# Patient Record
Sex: Male | Born: 1950 | Race: White | Hispanic: No | Marital: Single | Smoking: Former smoker
Health system: Southern US, Community
[De-identification: ages and names within clinical notes are randomized; demographics above are authoritative.]

## PROBLEM LIST (undated history)

## (undated) DIAGNOSIS — J449 Chronic obstructive pulmonary disease, unspecified: Secondary | ICD-10-CM

## (undated) DIAGNOSIS — I2510 Atherosclerotic heart disease of native coronary artery without angina pectoris: Secondary | ICD-10-CM

## (undated) DIAGNOSIS — K219 Gastro-esophageal reflux disease without esophagitis: Secondary | ICD-10-CM

## (undated) DIAGNOSIS — H9190 Unspecified hearing loss, unspecified ear: Secondary | ICD-10-CM

## (undated) DIAGNOSIS — Z9289 Personal history of other medical treatment: Secondary | ICD-10-CM

## (undated) DIAGNOSIS — R51 Headache: Secondary | ICD-10-CM

## (undated) DIAGNOSIS — R0602 Shortness of breath: Secondary | ICD-10-CM

## (undated) DIAGNOSIS — J069 Acute upper respiratory infection, unspecified: Secondary | ICD-10-CM

## (undated) DIAGNOSIS — E785 Hyperlipidemia, unspecified: Secondary | ICD-10-CM

## (undated) DIAGNOSIS — I10 Essential (primary) hypertension: Secondary | ICD-10-CM

## (undated) HISTORY — PX: EYE SURGERY: SHX253

## (undated) HISTORY — PX: CARDIAC CATHETERIZATION: SHX172

## (undated) HISTORY — DX: Personal history of other medical treatment: Z92.89

## (undated) HISTORY — DX: Essential (primary) hypertension: I10

## (undated) HISTORY — PX: OTHER SURGICAL HISTORY: SHX169

## (undated) HISTORY — PX: BACK SURGERY: SHX140

## (undated) HISTORY — DX: Hyperlipidemia, unspecified: E78.5

---

## 2007-09-28 ENCOUNTER — Ambulatory Visit (HOSPITAL_COMMUNITY): Admission: RE | Admit: 2007-09-28 | Discharge: 2007-09-28 | Payer: Self-pay | Admitting: Orthopedic Surgery

## 2009-10-11 ENCOUNTER — Ambulatory Visit (HOSPITAL_COMMUNITY): Admission: RE | Admit: 2009-10-11 | Discharge: 2009-10-11 | Payer: Self-pay | Admitting: Orthopedic Surgery

## 2010-03-29 HISTORY — PX: ESOPHAGOGASTRODUODENOSCOPY: SHX1529

## 2011-04-02 HISTORY — PX: LUNG REMOVAL, PARTIAL: SHX233

## 2011-04-04 ENCOUNTER — Telehealth: Payer: Self-pay | Admitting: Emergency Medicine

## 2011-04-04 NOTE — Telephone Encounter (Signed)
I spoke with Caleb [name] and she states she was scheduling pt's PET and stated many showed up in the lsit and did not know which one to pick. I advised her it was per image skull base to thigh. She voiced her understanding and needed nothing further

## 2011-04-05 ENCOUNTER — Other Ambulatory Visit (HOSPITAL_COMMUNITY): Payer: Self-pay | Admitting: Nephrology

## 2011-04-05 DIAGNOSIS — R9389 Abnormal findings on diagnostic imaging of other specified body structures: Secondary | ICD-10-CM

## 2011-04-10 ENCOUNTER — Other Ambulatory Visit (HOSPITAL_COMMUNITY): Payer: Self-pay | Admitting: Nephrology

## 2011-04-10 ENCOUNTER — Encounter (HOSPITAL_COMMUNITY)
Admission: RE | Admit: 2011-04-10 | Discharge: 2011-04-10 | Disposition: A | Discharge status: Home / Self Care | Payer: 59 | Source: Ambulatory Visit | Attending: Nephrology | Admitting: Nephrology

## 2011-04-10 DIAGNOSIS — R911 Solitary pulmonary nodule: Secondary | ICD-10-CM | POA: Insufficient documentation

## 2011-04-10 DIAGNOSIS — I2510 Atherosclerotic heart disease of native coronary artery without angina pectoris: Secondary | ICD-10-CM | POA: Insufficient documentation

## 2011-04-10 DIAGNOSIS — R9389 Abnormal findings on diagnostic imaging of other specified body structures: Secondary | ICD-10-CM

## 2011-04-10 DIAGNOSIS — J438 Other emphysema: Secondary | ICD-10-CM | POA: Insufficient documentation

## 2011-04-10 DIAGNOSIS — I6529 Occlusion and stenosis of unspecified carotid artery: Secondary | ICD-10-CM | POA: Insufficient documentation

## 2011-04-10 LAB — GLUCOSE, CAPILLARY: Glucose-Capillary: 94 mg/dL (ref 70–99)

## 2011-04-10 MED ORDER — FLUDEOXYGLUCOSE F - 18 (FDG) INJECTION
17.8000 | Freq: Once | INTRAVENOUS | Status: AC | PRN
  Administered 2011-04-10: 17.8 via INTRAVENOUS

## 2011-04-17 ENCOUNTER — Encounter: Payer: Self-pay | Admitting: Emergency Medicine

## 2011-04-17 ENCOUNTER — Ambulatory Visit (INDEPENDENT_AMBULATORY_CARE_PROVIDER_SITE_OTHER): Payer: 59 | Admitting: Emergency Medicine

## 2011-04-17 DIAGNOSIS — J449 Chronic obstructive pulmonary disease, unspecified: Secondary | ICD-10-CM | POA: Insufficient documentation

## 2011-04-17 DIAGNOSIS — F17200 Nicotine dependence, unspecified, uncomplicated: Secondary | ICD-10-CM

## 2011-04-17 DIAGNOSIS — R222 Localized swelling, mass and lump, trunk: Secondary | ICD-10-CM | POA: Insufficient documentation

## 2011-04-17 DIAGNOSIS — Z720 Tobacco use: Secondary | ICD-10-CM

## 2011-04-17 NOTE — Assessment & Plan Note (Signed)
-   discussed cessation.  

## 2011-04-17 NOTE — Patient Instructions (Signed)
We will arrange for bronchoscopy at [facility] on 04/24/11 Your case will be discussed at thoracic conference to consider possible surgical biopsy instead.  Follow with Dr Delton Coombes in 1 month

## 2011-04-17 NOTE — Assessment & Plan Note (Signed)
-   will arrange for ENB, but will discuss case in [facility] conference to see if going straight to OR for resection makes more sense.  - spirometry today to see if he could tolerate resection.

## 2011-04-17 NOTE — Progress Notes (Signed)
Subjective:    Patient ID: Caleb [name], male    DOB: 04-01-1951, 61 y.o.   MRN: [id number]  HPI 61 yo man, smoker (80 pk-yrs), hx of COPD on Symbicort + prn SABA. Also hx allergies and HTN. Referred for CT scan chest with RUL mass in 04/01/2011. PET scan 04/10/11. He is feeling well, not coughing. The scan was prompted by URI and bronchitis in November and December. Has lot about 20 lbs over 3 months.    Review of Systems  Constitutional: Positive for unexpected weight change. Negative for fever.  HENT: Positive for congestion. Negative for ear pain, nosebleeds, sore throat, rhinorrhea, sneezing, trouble swallowing, dental problem, postnasal drip and sinus pressure.   Eyes: Negative.  Negative for redness and itching.  Respiratory: Positive for shortness of breath. Negative for cough, chest tightness and wheezing.   Cardiovascular: Negative.  Negative for palpitations and leg swelling.  Gastrointestinal: Negative.  Negative for nausea and vomiting.  Genitourinary: Negative.  Negative for dysuria.  Musculoskeletal: Positive for arthralgias. Negative for joint swelling.  Skin: Negative.  Negative for rash.  Neurological: Negative.  Negative for headaches.  Hematological: Negative.  Does not bruise/bleed easily.  Psychiatric/Behavioral: Negative for dysphoric mood. The patient is nervous/anxious.     Past Medical History  Diagnosis Date  . Hypertension   . Emphysema   . Hyperlipidemia   . Allergic rhinitis      Family History  Problem Relation Age of Onset  . Emphysema Mother   . Allergies Brother   . Allergies Sister     x2  . Heart disease Sister   . Heart failure Father   . Heart disease Mother   . Esophageal cancer Cousin   . Stomach cancer Cousin   . Lymphoma Cousin      History   Social History  . Marital Status: Married    Spouse Name: N/A    Number of Children: N/A  . Years of Education: N/A   Occupational History  . RETIRED     Journalist, newspaper, Engineer, drilling   Social History Main Topics  . Smoking status: Current Everyday Smoker -- 2.0 packs/day for 40 years    Types: Cigarettes  . Smokeless tobacco: Not on file  . Alcohol Use: Yes     rare ETOH use  . Drug Use: No  . Sexually Active: Not on file   Other Topics Concern  . Not on file   Social History Narrative  . No narrative on file     Allergies  Allergen Reactions  . Avelox (Moxifloxacin Hcl In Nacl)      No outpatient prescriptions prior to visit.        Objective:   Physical Exam  Gen: Pleasant, thin, in no distress,  normal affect  ENT: No lesions,  mouth clear,  oropharynx clear, no postnasal drip  Neck: No JVD, no TMG, no carotid bruits  Lungs: No use of accessory muscles, distant, clear without rales or rhonchi  Cardiovascular: RRR, heart sounds normal, no murmur or gallops, no peripheral edema  Musculoskeletal: No deformities, no cyanosis or clubbing  Neuro: alert, non focal  Skin: Warm, no lesions or rashes     Assessment & Plan:  Chest mass - will arrange for ENB, but will discuss case in [facility] conference to see if going straight to OR for resection makes more sense.  - spirometry today to see if he could tolerate resection.   Tobacco abuse - discussed cessation

## 2011-04-18 ENCOUNTER — Telehealth: Payer: Self-pay | Admitting: Emergency Medicine

## 2011-04-18 ENCOUNTER — Encounter (HOSPITAL_COMMUNITY): Payer: Self-pay | Admitting: Pharmacy Technician

## 2011-04-18 NOTE — Telephone Encounter (Signed)
Pt has found old cxr's from 2009 and 2011.  Dr Delton Coombes,  Does pt need to bring these by for you to review?  Please advise.

## 2011-04-18 NOTE — Telephone Encounter (Signed)
I spoke w him, he knows to bring them to [facility]

## 2011-04-18 NOTE — Telephone Encounter (Signed)
Pt called back to see if RB wants him to bring the CXRs here or not.  Pt stated he will be more than happy to bring them by if RB wants them.  Please advise.  Antionette Fairy

## 2011-04-19 ENCOUNTER — Encounter: Payer: 59 | Admitting: Cardiothoracic Surgery

## 2011-04-19 ENCOUNTER — Other Ambulatory Visit: Payer: Self-pay | Admitting: Cardiothoracic Surgery

## 2011-04-19 ENCOUNTER — Encounter: Payer: Self-pay | Admitting: Cardiothoracic Surgery

## 2011-04-19 ENCOUNTER — Institutional Professional Consult (permissible substitution) (INDEPENDENT_AMBULATORY_CARE_PROVIDER_SITE_OTHER): Payer: 59 | Admitting: Cardiothoracic Surgery

## 2011-04-19 VITALS — BP 100/64 | HR 72 | Resp 16 | Ht 66.0 in | Wt 132.0 lb

## 2011-04-19 DIAGNOSIS — R55 Syncope and collapse: Secondary | ICD-10-CM

## 2011-04-19 DIAGNOSIS — D381 Neoplasm of uncertain behavior of trachea, bronchus and lung: Secondary | ICD-10-CM

## 2011-04-19 DIAGNOSIS — R51 Headache: Secondary | ICD-10-CM

## 2011-04-19 DIAGNOSIS — D491 Neoplasm of unspecified behavior of respiratory system: Secondary | ICD-10-CM

## 2011-04-19 NOTE — Progress Notes (Signed)
[address]            [place] [address]          [phone]      Bradley Bostelman [facility] Medical Record #[id number] Date of Birth: 01-28-1951  Referring: Leslye Peer., MD Primary Care: Marylen Ponto, MD, MD  Chief Complaint:    Chief Complaint  Patient presents with  . Lung Mass    eval and treat    History of Present Illness:    Patient is a 61 year old smoker who developed upper respiratory infection symptoms in November of 2012. The symptoms were predominantly right upper chest wall discomfort and cough. Patient did lose almost 20 pounds in weight. The symptoms improved and then returned again in December. At that time he saw his primary care doctor and a chest x-ray was obtained there was a new right upper lobe lung nodule appreciated there was not present on previous chest x-rays a CT scan of the chest and PET scan was performed. The patient's case was discussed in multidisciplinary thoracic oncology clinic, and films were reviewed. The patient is referred to the surgical clinic to discuss treatment options.  He has known underlying emphysema with long-term smoking up to 40 years recently he has decreased to 5-6 cigarettes a day. He's had no past history of myocardial infarction 3-4 years ago he underwent cardiac catheterization in [facility] and was told that he had some minor coronary disease but nothing requiring invasive treatment.     Current Activity/ Functional Status: Patient is independent with mobility/ambulation, transfers, ADL's, IADL's.   Past Medical History  Diagnosis Date  . Hypertension   . Emphysema   . Hyperlipidemia   . Allergic rhinitis     Past Surgical History  Procedure Date  . Eye muscle surgery x3     2 left eye, 1 right eye  . Knee surgery, left   . Back surgery     Family History  Problem Relation Age of Onset  . Emphysema Mother   . Allergies Brother   . Allergies Sister     x2  .  Heart disease Sister Died MI age 86 46  . Heart failure Father Died 80 MI  . Heart disease Mother Died 76 MI  . Esophageal cancer Cousin   . Stomach cancer Cousin   . Lymphoma Cousin     History   Social History  . Marital Status: Married    Spouse Name: N/A    Number of Children: N/A  . Years of Education: N/A   Occupational History  . RETIRED     Journalist, newspaper, Paramedic   Social History Main Topics  . Smoking status: Current Everyday Smoker -- 2.0 packs/day for 40 years    Types: Cigarettes  . Smokeless tobacco: Not on file  . Alcohol Use: Yes     rare ETOH use  . Drug Use: No  . Sexually Active: Not on file      History  Smoking status  . Current Everyday Smoker -- 2.0 packs/day for 40 years  . Types: Cigarettes  Smokeless tobacco  . Not on file    History  Alcohol Use  . Yes    rare ETOH use     Allergies  Allergen Reactions  . Avelox (Moxifloxacin Hcl In Nacl) Rash  . Codeine Rash    Current Outpatient Prescriptions  Medication Sig Dispense Refill  . aspirin 81 MG tablet Take 81 mg by mouth 2 (two) times daily.       . Aspirin-Acetaminophen-Caffeine (GOODY HEADACHE PO) Take 1-2 packets by mouth 2 (two) times daily as needed. As needed for back pain & headaches.       [name] BENICAR HCT 40-12.5 MG per tablet Once daily      . budesonide-formoterol (SYMBICORT) 160-4.5 MCG/ACT inhaler Inhale 2 puffs into the lungs 2 (two) times daily.      . CRESTOR 10 MG tablet Take 10 mg by mouth daily. Once daily      . Multiple Vitamin (MULITIVITAMIN WITH MINERALS) TABS Take 1 tablet by mouth daily.      [name] omeprazole (PRILOSEC) 20 MG capsule Take 20 mg by mouth daily. Once daily      . VENTOLIN HFA 108 (90 BASE) MCG/ACT inhaler Inhale 2 puffs into the lungs every 6 (six) hours as needed. For shortness of breath           Review of Systems:     Cardiac Review of Systems: Y or N  Chest Pain [ y rt shoulder   ]  Resting SOB [  n ] Exertional SOB  Cove.Etienne  ]    Orthopnea [ y ]   Pedal Edema [ n  ]    Palpitations [n  ] Syncope  [ n ]   Presyncope [ n  ]  General Review of Systems: [Y] = yes [  ]=no Constitional: recent weight change [ y ]; anorexia [ n ]; fatigue Cove.Etienne  ]; nausea [  n ]; night sweats [n  ]; fever [ n ]; or chills [ n ];                                                                                                                                          Dental: poor dentition[  ]; Last Dentist visit:   Eye : blurred vision [  ]; diplopia [   ]; vision changes [  ];  Amaurosis fugax[  ]; Resp: cough [ y ];  wheezing[ y ];  hemoptysis[ n ]; shortness of breath[ y ]; paroxysmal nocturnal dyspnea[  n]; dyspnea on exertion[y  ]; or orthopnea[n  ];  GI:  gallstones[  ], vomiting[  ];  dysphagia[  ]; melena[  ];  hematochezia [  n]; heartburn[  ];   Hx of  Colonoscopy[  ]; GU: kidney stones [  ]; hematuria[  ];   dysuria [  ];  nocturia[  ];  history of     obstruction [  ];             Skin: rash, swelling[  ];, hair loss[  ];  peripheral edema[  ];  or itching[  ]; Musculosketetal: myalgias[  ];  joint swelling[  ];  joint erythema[  ];  joint pain[ y ];  back pain[y  ];  Heme/Lymph: bruising[  ];  bleeding[  ];  anemia[  ];  Neuro: TIA[  ];  headaches[  ];  stroke[  ];  vertigo[  ];  seizures[  ];   paresthesias[  ];  difficulty walking[  ];  Psych:depression[  ]; anxiety[  ];  Endocrine: diabetes[  ];  thyroid dysfunction[  ];  Immunizations: Flu [ n ]; Pneumococcal[  n];  Other:  Physical Exam: BP 100/64  Pulse 72  Resp 16  Ht 5\' 6"  (1.676 m)  Wt 132 lb (59.875 kg)  BMI 21.31 kg/m2  SpO2 96%  General appearance: alert, cooperative, appears older than stated age and no distress Neurologic: intact Heart: regular rate and rhythm, S1, S2 normal, no murmur, click, rub or gallop Lungs: diminished breath sounds bibasilar Abdomen: soft, non-tender; bowel sounds normal; no masses,  no organomegaly Extremities: extremities normal,  atraumatic, no cyanosis or edema and Homans sign is negative, no sign of DVT No cervical or supraclavicular adenopathy is appreciated   Diagnostic Studies & Laboratory data:     Recent Radiology Findings:    Nm Pet Image Initial (pi) Skull Base To Thigh  04/10/2011  *RADIOLOGY REPORT*  Clinical Data: Initial treatment strategy for abnormal chest CT, demonstrating a right upper lobe 2.5 cm nodule.  NUCLEAR MEDICINE PET CT SKULL BASE TO THIGH  Technique:  17.8 mCi F-18 FDG was injected intravenously via the right AC.  Full-ring PET imaging was performed from the skull base through the mid-thighs 60  minutes after injection.  CT data was obtained and used for attenuation correction and anatomic localization only.  (This was not acquired as a diagnostic CT examination.)  Fasting Blood Glucose:  94  Patient Weight:  128 pounds.  Comparison: Chest CT of 04/01/2011  Findings: PET images demonstrate extensive muscular activity throughout the neck and upper chest.  The right apical lung nodule primarily demonstrates low level, non malignant range hypermetabolism.  For example, this measures a S.U.V. max of 1.8 on image 61.  Along its cephalad aspect, where it contacts the pleural surface, an area of more moderate hypermetabolism measures a S.U.V. max of 2.8, including on image 58.  No abnormal activity within the nodal stations of the mediastinum or hilum.  No abnormal activity within the abdomen or pelvis.  CT images performed for attenuation correction demonstrate right maxillary sinus mucous retention cyst or polyp.  Advanced carotid atherosclerosis.  Chest findings deferred to recent diagnostic chest CT.  Coronary artery atherosclerosis, including likely within the left main coronary artery on image 99.  No acute superimposed process identified.  There is moderate to marked centrilobular emphysema. Lumbosacral spine fixation.  IMPRESSION:  1.  The right apical lung nodule demonstrates primarily low level  nonmalignant range hypermetabolism.  A portion of its peripheral/pleural-based component demonstrates borderline malignant range hypermetabolism.  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  Tissue sampling should be considered. 2.  No evidence of hypermetabolic thoracic nodal or extrathoracic disease.  Given the relative low level activity of the suspicious right apical nodule, PET may be of decreased sensitivity for nodal metastasis. 3.  Coronary artery disease, including likely within the left main.  Original Report Authenticated By: Consuello Bossier, M.D.     Recent Lab Findings: No results found for this basename: WBC, HGB, HCT, PLT, GLUCOSE, CHOL, TRIG, HDL, LDLDIRECT, LDLCALC, ALT,  AST, NA, K, CL, CREATININE, BUN, CO2, TSH, INR, GLUF, HGBA1C      Assessment / Plan:     Patient is a 61 year old smoker which radiographically presents with a new right upper lobe lung mass, that was not present on chest x-rays from 2009 in 2011 in addition a CT scan of the neck did not show any right upper lobe lung mass. The mass is not significantly metabolically active on PET scan. Because of the new onset of the mass in a long-term smoker I recommended to the patient that we proceed with surgical resection. Needle biopsy or EnG was discussed with the patient however was felt that since the lesion was new even a negative biopsy would not prevent proceeding with resection.  The patient does have a very strong family history of coronary occlusive disease and is noted to have calcium in his left main on CT scan. Prior to any surgical intervention we will obtain cardiology clearance. The patient will have a full pulmonary function evaluation MRI of the brain to rule out possible metastatic disease although he is asymptomatic.  The risks and options of VATS and lung resection are discussed with the patient and  his wife in detail. He will return next week after preoperative studies are completed to set a definite surgical date        Delight Ovens MD  Beeper [phone] Office [phone] 04/19/2011 4:56 PM

## 2011-04-19 NOTE — Patient Instructions (Signed)
Smoking Cessation This document explains the best ways for you to quit smoking and new treatments to help. It lists new medicines that can double or triple your chances of quitting and quitting for good. It also considers ways to avoid relapses and concerns you may have about quitting, including weight gain. NICOTINE: A POWERFUL ADDICTION If you have tried to quit smoking, you know how hard it can be. It is hard because nicotine is a very addictive drug. For some people, it can be as addictive as heroin or cocaine. Usually, people make 2 or 3 tries, or more, before finally being able to quit. Each time you try to quit, you can learn about what helps and what hurts. Quitting takes hard work and a lot of effort, but you can quit smoking. QUITTING SMOKING IS ONE OF THE MOST IMPORTANT THINGS YOU WILL EVER DO.  You will live longer, feel better, and live better.   The impact on your body of quitting smoking is felt almost immediately:   Within 20 minutes, blood pressure decreases. Pulse returns to its normal level.   After 8 hours, carbon monoxide levels in the blood return to normal. Oxygen level increases.   After 24 hours, chance of heart attack starts to decrease. Breath, hair, and body stop smelling like smoke.   After 48 hours, damaged nerve endings begin to recover. Sense of taste and smell improve.   After 72 hours, the body is virtually free of nicotine. Bronchial tubes relax and breathing becomes easier.   After 2 to 12 weeks, lungs can hold more air. Exercise becomes easier and circulation improves.   Quitting will reduce your risk of having a heart attack, stroke, cancer, or lung disease:   After 1 year, the risk of coronary heart disease is cut in half.   After 5 years, the risk of stroke falls to the same as a nonsmoker.   After 10 years, the risk of lung cancer is cut in half and the risk of other cancers decreases significantly.   After 15 years, the risk of coronary heart  disease drops, usually to the level of a nonsmoker.   If you are pregnant, quitting smoking will improve your chances of having a healthy baby.   The people you live with, especially your children, will be healthier.   You will have extra money to spend on things other than cigarettes.  FIVE KEYS TO QUITTING Studies have shown that these 5 steps will help you quit smoking and quit for good. You have the best chances of quitting if you use them together: 1. Get ready.  2. Get support and encouragement.  3. Learn new skills and behaviors.  4. Get medicine to reduce your nicotine addiction and use it correctly.  5. Be prepared for relapse or difficult situations. Be determined to continue trying to quit, even if you do not succeed at first.  1. GET READY  Set a quit date.   Change your environment.   Get rid of ALL cigarettes, ashtrays, matches, and lighters in your home, car, and place of work.   Do not let people smoke in your home.   Review your past attempts to quit. Think about what worked and what did not.   Once you quit, do not smoke. NOT EVEN A PUFF!  2. GET SUPPORT AND ENCOURAGEMENT Studies have shown that you have a better chance of being successful if you have help. You can get support in many ways.  Tell   your family, friends, and coworkers that you are going to quit and need their support. Ask them not to smoke around you.   Talk to your caregivers (doctor, dentist, nurse, pharmacist, psychologist, and/or smoking counselor).   Get individual, group, or telephone counseling and support. The more counseling you have, the better your chances are of quitting. Programs are available at local hospitals and health centers. Call your local health department for information about programs in your area.   Spiritual beliefs and practices may help some smokers quit.   Quit meters are small computer programs online or downloadable that keep track of quit statistics, such as amount  of "quit-time," cigarettes not smoked, and money saved.   Many smokers find one or more of the many self-help books available useful in helping them quit and stay off tobacco.  3. LEARN NEW SKILLS AND BEHAVIORS  Try to distract yourself from urges to smoke. Talk to someone, go for a walk, or occupy your time with a task.   When you first try to quit, change your routine. Take a different route to work. Drink tea instead of coffee. Eat breakfast in a different place.   Do something to reduce your stress. Take a hot bath, exercise, or read a book.   Plan something enjoyable to do every day. Reward yourself for not smoking.   Explore interactive web-based programs that specialize in helping you quit.  4. GET MEDICINE AND USE IT CORRECTLY Medicines can help you stop smoking and decrease the urge to smoke. Combining medicine with the above behavioral methods and support can quadruple your chances of successfully quitting smoking. The [place] Food and Drug Administration (FDA) has approved 7 medicines to help you quit smoking. These medicines fall into 3 categories.  Nicotine replacement therapy (delivers nicotine to your body without the negative effects and risks of smoking):   Nicotine gum: Available over-the-counter.   Nicotine lozenges: Available over-the-counter.   Nicotine inhaler: Available by prescription.   Nicotine nasal spray: Available by prescription.   Nicotine skin patches (transdermal): Available by prescription and over-the-counter.   Antidepressant medicine (helps people abstain from smoking, but how this works is unknown):   Bupropion sustained-release (SR) tablets: Available by prescription.   Nicotinic receptor partial agonist (simulates the effect of nicotine in your brain):   Varenicline tartrate tablets: Available by prescription.   Ask your caregiver for advice about which medicines to use and how to use them. Carefully read the information on the package.    Everyone who is trying to quit may benefit from using a medicine. If you are pregnant or trying to become pregnant, nursing an infant, you are under age 18, or you smoke fewer than 10 cigarettes per day, talk to your caregiver before taking any nicotine replacement medicines.   You should stop using a nicotine replacement product and call your caregiver if you experience nausea, dizziness, weakness, vomiting, fast or irregular heartbeat, mouth problems with the lozenge or gum, or redness or swelling of the skin around the patch that does not go away.   Do not use any other product containing nicotine while using a nicotine replacement product.   Talk to your caregiver before using these products if you have diabetes, heart disease, asthma, stomach ulcers, you had a recent heart attack, you have high blood pressure that is not controlled with medicine, a history of irregular heartbeat, or you have been prescribed medicine to help you quit smoking.  5. BE PREPARED FOR RELAPSE OR   DIFFICULT SITUATIONS  Most relapses occur within the first 3 months after quitting. Do not be discouraged if you start smoking again. Remember, most people try several times before they finally quit.   You may have symptoms of withdrawal because your body is used to nicotine. You may crave cigarettes, be irritable, feel very hungry, cough often, get headaches, or have difficulty concentrating.   The withdrawal symptoms are only temporary. They are strongest when you first quit, but they will go away within 10 to 14 days.  Here are some difficult situations to watch for:  Alcohol. Avoid drinking alcohol. Drinking lowers your chances of successfully quitting.   Caffeine. Try to reduce the amount of caffeine you consume. It also lowers your chances of successfully quitting.   Other smokers. Being around smoking can make you want to smoke. Avoid smokers.   Weight gain. Many smokers will gain weight when they quit, usually  less than 10 pounds. Eat a healthy diet and stay active. Do not let weight gain distract you from your main goal, quitting smoking. Some medicines that help you quit smoking may also help delay weight gain. You can always lose the weight gained after you quit.   Bad mood or depression. There are a lot of ways to improve your mood other than smoking.  If you are having problems with any of these situations, talk to your caregiver. SPECIAL SITUATIONS AND CONDITIONS Studies suggest that everyone can quit smoking. Your situation or condition can give you a special reason to quit.  Pregnant women/new mothers: By quitting, you protect your baby's health and your own.   Hospitalized patients: By quitting, you reduce health problems and help healing.   Heart attack patients: By quitting, you reduce your risk of a second heart attack.   Lung, head, and neck cancer patients: By quitting, you reduce your chance of a second cancer.   Parents of children and adolescents: By quitting, you protect your children from illnesses caused by secondhand smoke.  QUESTIONS TO THINK ABOUT Think about the following questions before you try to stop smoking. You may want to talk about your answers with your caregiver.  Why do you want to quit?   If you tried to quit in the past, what helped and what did not?   What will be the most difficult situations for you after you quit? How will you plan to handle them?   Who can help you through the tough times? Your family? Friends? Caregiver?   What pleasures do you get from smoking? What ways can you still get pleasure if you quit?  Here are some questions to ask your caregiver:  How can you help me to be successful at quitting?   What medicine do you think would be best for me and how should I take it?   What should I do if I need more help?   What is smoking withdrawal like? How can I get information on withdrawal?  Quitting takes hard work and a lot of effort,  but you can quit smoking. FOR MORE INFORMATION  Smokefree.gov (http://www.smokefree.gov) provides free, accurate, evidence-based information and professional assistance to help support the immediate and long-term needs of people trying to quit smoking. Document Released: 03/12/2001 Document Revised: 11/28/2010 Document Reviewed: 01/02/2009 [employer] Patient Information 2012 [employer].Pulmonary Nodule A pulmonary (lung) nodule is small, round growth in the lung. The size of a pulmonary nodule can be as small as a pencil eraser (1/5 inch or 4 millmeters)   to a little bigger than your biggest toenail (1 inch or 25 millimeters). A pulmonary nodule is usually an unplanned finding. It may be found on a chest X-ray or a computed tomography (CT) scan when you have imaging tests of your lungs done. When a pulmonary nodule is found, tests will be done to determine if the nodule is benign (not cancerous) or malignant (cancerous). Follow-up treatment or testing is based on the size of the pulmonary nodule and your risk of getting lung cancer.  CAUSES Causes of pulmonary nodules can vary.  Benign pulmonary nodules  can be caused from different things. Some of these things include:  Infection. This can be a common cause of a benign pulmonary nodule. The infection may be active (a current infection) or an old infection that is no longer active. Three types of infections can cause a pulmonary nodule. These are:   Bacterial Infection.   Fungal infection.   Viral Infections.   Hematoma. This is a bruise in the lung. A hematoma can happen from an injury to your chest.   Some common diseases can lead to benign pulmonary nodules. For example, rheumatoid arthritis can be a cause of a pulmonary nodule.   Other unusual things can cause a benign pulmonary nodule. These can include:   Having had tuberculosis.   Rare diseases, such as a lung cyst.  Malignant pulmonary nodules.  These are cancerous growths. The  cancer may have:  Started in the lung. Some lung cancers first detected as a pulmonary nodule.   Spread to the lung from cancer somewhere else in the body. This is called metastatic cancer.   Certain risk factors make a cancerous pulmonary nodule more likely. They include:   Age. As people get older, a pulmonary nodule is more likely to be cancerous.   Cancer history. If one of your immediate family members has had cancer, you have a higher risk of developing cancer.   Smoking. This includes people who currently smoke and those who have quit.  DIAGNOSIS To diagnose whether a pulmonary nodule is benign or malignant, a variety of tests will be done. This includes things such as:  Health history. Questions regarding your current health, past health, and family health will be asked.   Blood tests. Results of blood work can show:   Tumor markers for cancer.   Any type of infection.   A skin test called a tuberculin (TB) test may be done. This test can tell if you have been exposed to the germ that causes tuberculosis.   Imaging tests. These take pictures of your lungs. Types of imaging tests include:   Chest X-ray. This can help in several ways. An X-ray gives a close-up look at the pulmonary nodule. A new X-ray can be compared with any X-rays you have had in the past.    Computed tomography  (CT) scan. This test shows smaller pulmonary nodules more clearly than an X-ray.   Positron emission tomography  (PET) scan. This is a test that uses a radioactive substance to identify a pulmonary nodule. A safe amount of radioactive substance is injected into the blood stream. Then, the scan takes a picture of the pulmonary nodule. A malignant pulmonary nodule will absorb the substance faster than a benign pulmonary nodule. The radioactive substance is eliminated from your body in your urine.   Biopsy.  This removes a tiny piece of the pulmonary nodule so it can be checked under a microscope.  Medicine will be given   to help keep you relaxed and pain free when a biopsy is done. Types of biopsies include:   Bronchoscopy . This is a surgical procedure. It can be used for pulmonary nodules that are close to the airways in the lung. It uses a scope (a thin tube) with a tiny camera and light on the end. The scope is put in the windpipe. Your caregiver can then see inside the lung. A tiny tool put through the scope is used to take a small sample of the pulmonary nodule tissue.   Transthoracic needle aspiration . This method is used if the pulmonary nodule is far away from the air passages in the lung. A long, thin needle is put through the chest into the lung nodule. A CT scan is done at the same time which can make it easier to locate the pulmonary nodule.   Surgical lung biopsy . This is a surgical procedure in which the pulmonary nodule is removed. This is usually recommended when the pulmonary nodule is most likely malignant or a biopsy cannot be obtained by either bronchoscopy or transthoracic needle aspiration.  PULMONARY NODULE FOLLOW-UP RECOMMENDATIONS The frequency of pulmonary nodule follow-up is based on your risk factors and size of the pulmonary nodule. If your caregiver suspects the pulmonary nodule is cancerous or the pulmonary nodule changes during any of the follow-up CT scans, additional testing or biopsies will be done.   If you have no or low risk of getting lung cancer (non-smoker, no personal cancer history), recommended follow-up is based on the following pulmonary nodule size:   A pulmonary nodule that is < 4 mm does not require any follow-up.   A pulmonary nodule that is 4 to 6 mm should be re-imaged by CT scan in 12 months.   A pulmonary nodule that is 6 to 8 mm should be re-imaged by CT scan at 6 to 12 months and then again at 18 to 24 months if no change in size.   A pulmonary nodule > 8 mm in size should be followed closely and re-imaged by CT scan at 3, 9, and 24  months.    If you are at risk of getting lung cancer (current or former smoker, family history of cancer), recommended follow-up is based on the following pulmonary nodule size:   A pulmonary nodule that is < 4 mm in size should be re-imaged by CT scan in 12 months.   A pulmonary nodule that is 4 to 6 mm in size should be re-imaged by CT scan at 6 to 12 months and again at 18 to 24 months.   A pulmonary nodule that is 6 to 8 mm in size should be re-imaged by CT scan at 3, 9, and 24 months.   A pulmonary nodule > 8 mm in size should be followed closely and re-imaged by CT scan at 3, 9, and 24 months.  SEEK MEDICAL CARE IF: While waiting for test results to determine what type of pulmonary nodule you have, be sure to contact your caregiver if you:  Have trouble breathing when you are active.   Feel sick or unusually tired.   Do not feel like eating.   Lose weight without trying to.   Develop chills or night sweats.   Mild or moderate fevers generally have no long-term effects and often do not require treatment. There are a few exceptions (see below).  SEEK IMMEDIATE MEDICAL CARE IF:  You cannot catch your breath or you begin   wheezing.   You cannot stop coughing.   You cough up blood.   You feel like you are going to pass out or become dizzy.   You have sudden chest pain.   You have a fever or persistent symptoms for more than 72 hours.   You have a fever and your symptoms suddenly get worse.  MAKE SURE YOU   Understand these instructions.   Will watch your condition.   Will get help right away if you are not doing well or get worse.  Document Released: 01/13/2009 Document Revised: 11/28/2010 Document Reviewed: 01/13/2009 [employer] Patient Information 2012 [employer]. 

## 2011-04-22 ENCOUNTER — Telehealth: Payer: Self-pay | Admitting: Emergency Medicine

## 2011-04-22 ENCOUNTER — Other Ambulatory Visit (HOSPITAL_COMMUNITY): Payer: Self-pay | Admitting: Respiratory Therapy

## 2011-04-22 NOTE — Telephone Encounter (Signed)
I spoke with the pt and he states when he saw Dr. Tyrone Sage he mentioned that the area on his lung could possible be inflammation. The pt states he does not want to go through the biopsy and "lose a piece of his lung' if it is only inflammation. He also wants to know if there was any medication he could be on in the meantime to help with the inflammation. I advised in order to find out what the area is in his lungs he would need the biopsy to be sure what it is in order to properly treat it. I also advised I was not aware of any medication that he could take at this time. The pt asked that I send the message to Dr. Delton Coombes to get his advice and possibly call the patient and explain to him what it will mean if this is inflammation. Please advise. Carron Curie, CMA

## 2011-04-23 ENCOUNTER — Inpatient Hospital Stay (HOSPITAL_COMMUNITY)
Admission: RE | Admit: 2011-04-23 | Discharge: 2011-04-23 | Disposition: A | Discharge status: Home / Self Care | Payer: 59 | Source: Ambulatory Visit

## 2011-04-23 ENCOUNTER — Other Ambulatory Visit (HOSPITAL_COMMUNITY): Payer: Self-pay | Admitting: Radiology

## 2011-04-23 ENCOUNTER — Ambulatory Visit (HOSPITAL_COMMUNITY)
Admission: RE | Admit: 2011-04-23 | Discharge: 2011-04-23 | Disposition: A | Discharge status: Home / Self Care | Payer: 59 | Source: Ambulatory Visit | Attending: Cardiothoracic Surgery | Admitting: Cardiothoracic Surgery

## 2011-04-23 ENCOUNTER — Ambulatory Visit
Admission: RE | Admit: 2011-04-23 | Discharge: 2011-04-23 | Disposition: A | Discharge status: Home / Self Care | Payer: 59 | Source: Ambulatory Visit | Attending: Cardiothoracic Surgery | Admitting: Cardiothoracic Surgery

## 2011-04-23 DIAGNOSIS — J984 Other disorders of lung: Secondary | ICD-10-CM | POA: Insufficient documentation

## 2011-04-23 DIAGNOSIS — R51 Headache: Secondary | ICD-10-CM

## 2011-04-23 DIAGNOSIS — D381 Neoplasm of uncertain behavior of trachea, bronchus and lung: Secondary | ICD-10-CM

## 2011-04-23 DIAGNOSIS — R55 Syncope and collapse: Secondary | ICD-10-CM

## 2011-04-23 LAB — BLOOD GAS, ARTERIAL
Acid-Base Excess: 0.7 mmol/L (ref 0.0–2.0)
Bicarbonate: 24.2 mEq/L — ABNORMAL HIGH (ref 20.0–24.0)
Drawn by: 242311
FIO2: 0.21 %
O2 Saturation: 97.4 %
Patient temperature: 98.6
TCO2: 25.3 mmol/L (ref 0–100)
pCO2 arterial: 34.8 mmHg — ABNORMAL LOW (ref 35.0–45.0)
pH, Arterial: 7.456 — ABNORMAL HIGH (ref 7.350–7.450)
pO2, Arterial: 89.9 mmHg (ref 80.0–100.0)

## 2011-04-23 LAB — PULMONARY FUNCTION TEST

## 2011-04-23 MED ORDER — GADOBENATE DIMEGLUMINE 529 MG/ML IV SOLN
12.0000 mL | Freq: Once | INTRAVENOUS | Status: AC | PRN
  Administered 2011-04-23: 12 mL via INTRAVENOUS

## 2011-04-23 MED ORDER — ALBUTEROL SULFATE (5 MG/ML) 0.5% IN NEBU
2.5000 mg | INHALATION_SOLUTION | Freq: Once | RESPIRATORY_TRACT | Status: AC
  Administered 2011-04-23: 2.5 mg via RESPIRATORY_TRACT

## 2011-04-23 NOTE — Telephone Encounter (Signed)
Spoke with patient and answered his questions. Have reviewed case with Dr Tyrone Sage, he agrees that patient needs resection.

## 2011-04-24 ENCOUNTER — Encounter (HOSPITAL_COMMUNITY): Admission: RE | Payer: Self-pay | Source: Ambulatory Visit

## 2011-04-24 ENCOUNTER — Ambulatory Visit (HOSPITAL_COMMUNITY): Admission: RE | Admit: 2011-04-24 | Payer: 59 | Source: Ambulatory Visit | Admitting: Emergency Medicine

## 2011-04-24 SURGERY — BRONCHOSCOPY, WITH EBUS
Anesthesia: General | Laterality: Right

## 2011-04-25 ENCOUNTER — Encounter: Payer: Self-pay | Admitting: Cardiovascular Disease

## 2011-04-25 ENCOUNTER — Ambulatory Visit (INDEPENDENT_AMBULATORY_CARE_PROVIDER_SITE_OTHER): Payer: 59 | Admitting: Cardiovascular Disease

## 2011-04-25 DIAGNOSIS — I2510 Atherosclerotic heart disease of native coronary artery without angina pectoris: Secondary | ICD-10-CM

## 2011-04-25 DIAGNOSIS — R0602 Shortness of breath: Secondary | ICD-10-CM

## 2011-04-25 NOTE — Progress Notes (Signed)
HPI:  This is a 61 year old gentleman presenting for preoperative cardiac evaluation.  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 Tyrone Sage. He developed right upper chest wall pain with associated cough and weight loss. A chest x-ray showed interval development of a right upper lung nodule and this was followed up with a CT scan and PET scan.  The CT scan demonstrated incidental coronary calcification involving the left main coronary artery. Because of this finding he was referred for preoperative evaluation.  The patient has undergone previous cardiac catheterization in [facility]. This was greater than 5 years ago and by the patient's recollection there was nonobstructive disease present. He has never required coronary intervention or revascularization.  The patient denies chest pain at rest or with exertion. He walks twice per day and has no symptoms with walking. He just quit smoking last week and has smoked for greater than 40 years. He does complain of progressive dyspnea with exertion. He has occasional palpitations. He feels lightheaded when he first stands up. He denies syncope, orthopnea, PND, or leg swelling.  Outpatient Encounter Prescriptions as of 04/25/2011  Medication Sig Dispense Refill  . aspirin 81 MG tablet Take 81 mg by mouth 2 (two) times daily.       . Aspirin-Acetaminophen-Caffeine (GOODY HEADACHE PO) Take 1-2 packets by mouth 2 (two) times daily as needed. As needed for back pain & headaches.       [name] BENICAR HCT 40-12.5 MG per tablet Once daily      . budesonide-formoterol (SYMBICORT) 160-4.5 MCG/ACT inhaler Inhale 2 puffs into the lungs 2 (two) times daily.      . CRESTOR 10 MG tablet Take 10 mg by mouth daily. Once daily      . Multiple Vitamin (MULITIVITAMIN WITH MINERALS) TABS Take 1 tablet by mouth daily.      [name] omeprazole (PRILOSEC) 20 MG capsule Take 20 mg by mouth daily. Once daily      . VENTOLIN HFA 108 (90 BASE) MCG/ACT  inhaler Inhale 2 puffs into the lungs every 6 (six) hours as needed. For shortness of breath        Avelox and Codeine  Past Medical History  Diagnosis Date  . Hypertension   . Emphysema   . Hyperlipidemia   . Allergic rhinitis     Past Surgical History  Procedure Date  . Eye muscle surgery x3     2 left eye, 1 right eye  . Knee surgery, left   . Back surgery     History   Social History  . Marital Status: Married    Spouse Name: N/A    Number of Children: N/A  . Years of Education: N/A   Occupational History  . RETIRED     Journalist, newspaper, Paramedic   Social History Main Topics  . Smoking status: Current Everyday Smoker -- 2.0 packs/day for 40 years    Types: Cigarettes  . Smokeless tobacco: Not on file  . Alcohol Use: Yes     rare ETOH use  . Drug Use: No  . Sexually Active: Not on file   Other Topics Concern  . Not on file   Social History Narrative  . No narrative on file    Family History  Problem Relation Age of Onset  . Emphysema Mother   . Allergies Brother   . Allergies Sister     x2  . Heart disease Sister   . Heart  failure Father   . Heart disease Mother   . Esophageal cancer Cousin   . Stomach cancer Cousin   . Lymphoma Cousin     ROS: General: no fevers/chills/night sweats Eyes: no blurry vision, diplopia, or amaurosis ENT: no sore throat or hearing loss Resp: no cough, wheezing, or hemoptysis. Positive for shortness of breath CV: no edema or palpitations GI: no abdominal pain, nausea, vomiting, diarrhea, or constipation GU: no dysuria, frequency, or hematuria Skin: no rash Neuro: no headache, numbness, tingling, or weakness of extremities Musculoskeletal: no joint pain or swelling Heme: no bleeding, DVT, or easy bruising Endo: no polydipsia or polyuria  BP 110/74  Pulse 62  Ht 5\' 6"  (1.676 m)  Wt 57.788 kg (127 lb 6.4 oz)  BMI 20.56 kg/m2  PHYSICAL EXAM: Pt is alert and oriented, WD, WN, in no distress. HEENT:  normal Neck: JVP normal. Carotid upstrokes normal without bruits. No thyromegaly. Lungs: equal expansion, diminished breath sounds bilaterally CV: Apex is nonpalpable, RRR without murmur or gallop, distant heart sounds Abd: soft, NT, +BS, no bruit, no hepatosplenomegaly Back: no CVA tenderness Ext: no C/C/E        Femoral pulses 2+=         DP/PT pulses intact and = Skin: warm and dry without rash Neuro: CNII-XII intact             Strength intact = bilaterally  EKG:  Normal sinus rhythm 62 beats per minute, cannot rule out age indeterminate septal infarct, otherwise within normal limits.  ASSESSMENT AND PLAN:

## 2011-04-25 NOTE — Patient Instructions (Signed)
Your physician has requested that you have an exercise stress myoview. For further information please visit [email or web address] Please follow instruction sheet, as given.  Your physician recommends that you schedule a follow-up appointment as needed.

## 2011-04-25 NOTE — Assessment & Plan Note (Signed)
The patient has coronary artery disease based on chest CT findings. He has upcoming surgery planned involving resection of a lung mass. The patient is limited by exertional dyspnea, and I think it is prudent to evaluate him with a preoperative stress test. He feels like he can walk on the treadmill so we will try an exercise Myoview scan to rule out significant ischemia. As long as his stress test is of low risk, I would recommend proceeding with surgery without further cardiac testing. We'll followup with him after the results of the stress test are available so that surgery can be scheduled.

## 2011-04-29 ENCOUNTER — Ambulatory Visit (HOSPITAL_COMMUNITY): Payer: 59 | Attending: Cardiovascular Disease | Admitting: Radiology

## 2011-04-29 DIAGNOSIS — R42 Dizziness and giddiness: Secondary | ICD-10-CM | POA: Insufficient documentation

## 2011-04-29 DIAGNOSIS — R0989 Other specified symptoms and signs involving the circulatory and respiratory systems: Secondary | ICD-10-CM | POA: Insufficient documentation

## 2011-04-29 DIAGNOSIS — J984 Other disorders of lung: Secondary | ICD-10-CM | POA: Insufficient documentation

## 2011-04-29 DIAGNOSIS — Z87891 Personal history of nicotine dependence: Secondary | ICD-10-CM | POA: Insufficient documentation

## 2011-04-29 DIAGNOSIS — Z01810 Encounter for preprocedural cardiovascular examination: Secondary | ICD-10-CM | POA: Insufficient documentation

## 2011-04-29 DIAGNOSIS — R002 Palpitations: Secondary | ICD-10-CM | POA: Insufficient documentation

## 2011-04-29 DIAGNOSIS — I10 Essential (primary) hypertension: Secondary | ICD-10-CM | POA: Insufficient documentation

## 2011-04-29 DIAGNOSIS — J449 Chronic obstructive pulmonary disease, unspecified: Secondary | ICD-10-CM | POA: Insufficient documentation

## 2011-04-29 DIAGNOSIS — I2510 Atherosclerotic heart disease of native coronary artery without angina pectoris: Secondary | ICD-10-CM

## 2011-04-29 DIAGNOSIS — Z720 Tobacco use: Secondary | ICD-10-CM

## 2011-04-29 DIAGNOSIS — R5381 Other malaise: Secondary | ICD-10-CM | POA: Insufficient documentation

## 2011-04-29 DIAGNOSIS — R0602 Shortness of breath: Secondary | ICD-10-CM | POA: Insufficient documentation

## 2011-04-29 DIAGNOSIS — J4489 Other specified chronic obstructive pulmonary disease: Secondary | ICD-10-CM | POA: Insufficient documentation

## 2011-04-29 DIAGNOSIS — E785 Hyperlipidemia, unspecified: Secondary | ICD-10-CM | POA: Insufficient documentation

## 2011-04-29 DIAGNOSIS — R000 Tachycardia, unspecified: Secondary | ICD-10-CM | POA: Insufficient documentation

## 2011-04-29 DIAGNOSIS — R0609 Other forms of dyspnea: Secondary | ICD-10-CM | POA: Insufficient documentation

## 2011-04-29 MED ORDER — TECHNETIUM TC 99M TETROFOSMIN IV KIT
11.0000 | PACK | Freq: Once | INTRAVENOUS | Status: AC | PRN
  Administered 2011-04-29: 11 via INTRAVENOUS

## 2011-04-29 MED ORDER — TECHNETIUM TC 99M TETROFOSMIN IV KIT
33.0000 | PACK | Freq: Once | INTRAVENOUS | Status: AC | PRN
  Administered 2011-04-29: 33 via INTRAVENOUS

## 2011-04-29 NOTE — Progress Notes (Signed)
[facility] SITE 3 NUCLEAR MED [address] [place] [address] [phone]  Cardiology Nuclear Med Study  Caleb [name] is a 61 y.o. 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 Tyrone Sage History:  COPD, Emphysema,>46yrs- GXT([place]/stress echo?) and>5yrs- Heart Catheterization-(n/oDZ-[place]) Cardiac Risk Factors: History of Smoking, Hypertension and Lipids  Symptoms:  Dizziness, DOE, Fatigue, Light-Headedness, Palpitations, Rapid HR and SOB   Nuclear Pre-Procedure Caffeine/Decaff Intake:  None NPO After: 5:00am   Lungs:  clear IV 0.9% NS with Angio Cath:  20g  IV Site: R Forearm  IV Started by:  Stanton Kidney, EMT-P  Chest Size (in):  38 Cup Size: n/a  Height: 5\' 6"  (1.676 m)  Weight:  126 lb (57.153 kg)  BMI:  Body mass index is 20.34 kg/(m^2). Tech Comments:  NA    Nuclear Med Study 1 or 2 day study: 1 day  Stress Test Type:  Stress  Reading MD: Dr.Katz Order Authorizing Provider:  M.Cooper, MD  Resting Radionuclide: Technetium 77m Tetrofosmin  Resting Radionuclide Dose: 11.0 mCi   Stress Radionuclide:  Technetium 74m Tetrofosmin  Stress Radionuclide Dose: 33.0 mCi           Stress Protocol Rest HR: 55 Stress HR: 141  Rest BP: 106/77 Stress BP: 139/75  Exercise Time (min): 11:30 mins METS: 13.20   Predicted Max HR: 162 bpm % Max HR: 85.8 bpm Rate Pressure Product: 21308   Dose of Adenosine (mg):  n/a Dose of Lexiscan: n/a mg  Dose of Atropine (mg): n/a Dose of Dobutamine: n/a mcg/kg/min (at max HR)  Stress Test Technologist: Frederick Peers, EMT-P  Nuclear Technologist:  Domenic Polite, CNMT     Rest Procedure:  Myocardial perfusion imaging was performed at rest 45 minutes following the intravenous administration of Technetium 20m Tetrofosmin. Rest ECG: SB  Stress Procedure:  The patient exercised for  11:30 mins.  The patient stopped due to leg fatigue/mild sob and denied any chest pain.  There were no significant ST-T wave changes/occ PVCs/PACs.  Technetium 40m Tetrofosmin was injected at peak exercise and myocardial perfusion imaging was performed after a brief delay. Stress ECG: No significant change from baseline ECG  QPS Raw Data Images:  Patient motion noted; appropriate software correction applied. Stress Images:  Normal homogeneous uptake in all areas of the myocardium. Rest Images:  Normal homogeneous uptake in all areas of the myocardium. Subtraction (SDS):  No evidence of ischemia. Transient Ischemic Dilatation (Normal <1.22):  1.09 Lung/Heart Ratio (Normal <0.45):  0.31  Quantitative Gated Spect Images QGS EDV:  104 ml QGS ESV:  43 ml QGS cine images:  Normal Wall Motion QGS EF: 58%  Impression Exercise Capacity:  Good exercise capacity. BP Response:  Normal blood pressure response. Clinical Symptoms:  No chest pain. ECG Impression:  No significant ST segment change suggestive of ischemia. Comparison with Prior Nuclear Study: No images to compare  Overall Impression:  Normal stress nuclear study.  Willa Rough, MD

## 2011-05-02 ENCOUNTER — Encounter: Payer: Self-pay | Admitting: Cardiothoracic Surgery

## 2011-05-02 ENCOUNTER — Other Ambulatory Visit: Payer: Self-pay

## 2011-05-02 ENCOUNTER — Ambulatory Visit (INDEPENDENT_AMBULATORY_CARE_PROVIDER_SITE_OTHER): Payer: 59 | Admitting: Cardiothoracic Surgery

## 2011-05-02 VITALS — BP 123/82 | HR 76 | Resp 16 | Ht 66.0 in | Wt 132.0 lb

## 2011-05-02 DIAGNOSIS — D381 Neoplasm of uncertain behavior of trachea, bronchus and lung: Secondary | ICD-10-CM

## 2011-05-02 DIAGNOSIS — D491 Neoplasm of unspecified behavior of respiratory system: Secondary | ICD-10-CM

## 2011-05-02 NOTE — Patient Instructions (Signed)
Thoracotomy is surgery on the chest using a large cut (incision) to get access to the organs inside. This type of surgery is often used to repair or remove diseased or damaged tissue in the chest. LET YOUR CAREGIVER KNOW ABOUT: Allergies to food or medicine.    Medicines taken, including vitamins, herbs, eyedrops, over-the-counter medicines, and creams.    Use of steroids (by mouth or creams).    Previous problems with anesthetics or numbing medicines.    History of bleeding problems or blood clots.    Previous surgery.    Other health problems, including diabetes and kidney problems.    Possibility of pregnancy, if this applies.  RISKS AND COMPLICATIONS Possible complications of general thoracic surgery include:    Respiratory failure.    Severe bleeding (hemorrhage).    Nerve injury.    Abnormal heart rhythm (arrhythmia).    Heart attack.    Blocked blood vessel (embolism).    Infection.   The chest tubes used for drainage after thoracic surgery may cause a buildup of fluid or air in the area around the lungs (pleural space). Both of these conditions can lead to lung collapse, trouble breathing, or other problems.  BEFORE THE PROCEDURE If you are not having emergency surgery, a complete medical history and exam will be done before surgery.    If you are a smoker, quit. This improves recovery and decreases complications.    Other tests may be done before the surgery. Your surgeon can explain these tests to you. Tests may include:    X-rays.   MRI.   Sputum culture.    CT scans.    Blood gas analysis.    Lung (pulmonary) function tests.    Electrocardiography.   Endoscopy.   Pulmonary angiography.    Do not eat for 10 to 12 hours prior to the surgery, or as directed by your surgeon.  PROCEDURE   You may be given medicine to help you relax (sedative) before surgery. An intravenous (IV) access is inserted into your arm or neck to give fluids and medicine. You will be given medicine  that makes you sleep (general anesthetic). A tube is placed down your throat, into your trachea for the procedure. The chest is cut open to expose the chest cavity. The incision will be made by your surgeon over the area of the chest which will expose the problem best. The procedure differs depending on what is wrong and what needs to be done. Following the procedure, a chest tube is inserted between the ribs to drain the wound and re-expand the collapsed lung. AFTER THE PROCEDURE You will be taken to a recovery room and watched closely. Depending on the procedure performed, your breathing tube may be removed, or you may need to stay in the intensive care unit (ICU) with the breathing tube still in place. Ask your caregiver what to expect.    Patients usually have moderate to severe pain following this surgery. Pain medicines will be given to keep you comfortable.    Chest tubes are watched closely for signs of fluid or air buildup in the lungs. Your caregiver will remove them when it is safe.    During the recovery period, respiratory therapists and nurses will work with you on deep breathing and coughing exercises to improve lung function.  Document Released: 12/15/2002 Document Revised: 11/28/2010 Document Reviewed: 08/31/2010 [facility] Patient Information 2012 [place], [place].  Thoracotomy Care After Refer to this sheet in the next few weeks. These  instructions provide you with information on caring for yourself after your procedure. Your caregiver may also give you more specific instructions. Your treatment has been planned according to current medical practices, but problems sometimes occur. Call your caregiver if you have any problems or questions after your procedure. HOME CARE INSTRUCTIONS  Remove the bandage (dressing) over your chest tube site as directed by your caregiver.   It is normal to be sore for a couple weeks following surgery. See your caregiver if this seems to be getting worse  rather than better.   Only take over-the-counter or prescription medicines for pain, discomfort, or fever as directed by your caregiver. It is very important to take pain medicine when you need it so that you will cough and breathe deeply enough to clear mucus (phlegm) and expand your lungs. This helps prevent a lung infection (pneumonia).   If it hurts to cough, hold a pillow against your chest when you cough. This may help with the discomfort. In spite of the discomfort, cough frequently.   Taking deep breaths keeps lungs inflated and protects against pneumonia. Most patients will go home with a device called an incentive spirometer that encourages deep breathing.   You may resume a normal diet and activities as directed.   Use showers for bathing until you see your caregiver, or as instructed.   Change dressings if necessary or as directed.   Avoid lifting or driving until you are instructed otherwise.   Make an appointment to see your caregiver for stitch (suture) or staple removal when instructed.   Do not travel by airplane for 2 weeks after the chest tube is removed.  SEEK MEDICAL CARE IF:  You are bleeding from your wounds.   Your heartbeat seems irregular.   You have redness, swelling, or increasing pain in the wounds.   There is pus coming from your wounds.   There is a bad smell coming from the wound or dressing.  SEEK IMMEDIATE MEDICAL CARE IF:  You have a fever.   You develop a rash.   You have difficulty breathing.   You develop any reaction or side effects to medicines given.   You develop lightheadedness or feel faint.   You develop shortness of breath or chest pain.  MAKE SURE YOU:  Understand these instructions.   Will watch your condition.   Will get help right away if you are not doing well or get worse.  Document Released: 08/31/2010 Document Revised: 11/28/2010 Document Reviewed: 08/31/2010 [facility] Patient Information 2012 [place],  [place].Thoracotomy Thoracotomy is surgery on the chest using a large cut (incision) to get access to the organs inside. This type of surgery is often used to repair or remove diseased or damaged tissue in the chest. LET YOUR CAREGIVER KNOW ABOUT:  Allergies to food or medicine.     Medicines taken, including vitamins, herbs, eyedrops, over-the-counter medicines, and creams.     Use of steroids (by mouth or creams).     Previous problems with anesthetics or numbing medicines.     History of bleeding problems or blood clots.     Previous surgery.     Other health problems, including diabetes and kidney problems.     Possibility of pregnancy, if this applies.  RISKS AND COMPLICATIONS  Possible complications of general thoracic surgery include:     Respiratory failure.     Severe bleeding (hemorrhage).     Nerve injury.     Abnormal heart rhythm (arrhythmia).  Heart attack.     Blocked blood vessel (embolism).     Infection.    The chest tubes used for drainage after thoracic surgery may cause a buildup of fluid or air in the area around the lungs (pleural space). Both of these conditions can lead to lung collapse, trouble breathing, or other problems.  BEFORE THE PROCEDURE  If you are not having emergency surgery, a complete medical history and exam will be done before surgery.     If you are a smoker, quit. This improves recovery and decreases complications.     Other tests may be done before the surgery. Your surgeon can explain these tests to you. Tests may include:     X-rays.    MRI.    Sputum culture.     CT scans.     Blood gas analysis.     Lung (pulmonary) function tests.     Electrocardiography.    Endoscopy.    Pulmonary angiography.     Do not eat for 10 to 12 hours prior to the surgery, or as directed by your surgeon.  PROCEDURE   You may be given medicine to help you relax (sedative) before surgery. An intravenous (IV) access is inserted  into your arm or neck to give fluids and medicine. You will be given medicine that makes you sleep (general anesthetic). A tube is placed down your throat, into your trachea for the procedure. The chest is cut open to expose the chest cavity. The incision will be made by your surgeon over the area of the chest which will expose the problem best. The procedure differs depending on what is wrong and what needs to be done. Following the procedure, a chest tube is inserted between the ribs to drain the wound and re-expand the collapsed lung. AFTER THE PROCEDURE  You will be taken to a recovery room and watched closely. Depending on the procedure performed, your breathing tube may be removed, or you may need to stay in the intensive care unit (ICU) with the breathing tube still in place. Ask your caregiver what to expect.     Patients usually have moderate to severe pain following this surgery. Pain medicines will be given to keep you comfortable.     Chest tubes are watched closely for signs of fluid or air buildup in the lungs. Your caregiver will remove them when it is safe.     During the recovery period, respiratory therapists and nurses will work with you on deep breathing and coughing exercises to improve lung function.  Document Released: 12/15/2002 Document Revised: 11/28/2010 Document Reviewed: 08/31/2010 [facility] Patient Information 2012 [place], [place].

## 2011-05-02 NOTE — Progress Notes (Addendum)
[address]            [place] [address]          [id number]        Caleb [name] [facility] Medical Record #[id number] Date of Birth: Dec 31, 1950  Referring: Leslye Peer., MD Primary Care: Marylen Ponto, MD, MD  Chief Complaint:    Chief Complaint  Patient presents with  . Lung Mass    further discussion regarding surgery after PFT'S, SURGICAL CLEARANCE, MRI BRAIN    History of Present Illness:    Patient is a 61 year old smoker who developed upper respiratory infection symptoms in November of 2012. The symptoms were predominantly right upper chest wall discomfort and cough. Patient did lose almost 20 pounds in weight. The symptoms improved and then returned again in December. At that time he saw his primary care doctor and a chest x-ray was obtained there was a new right upper lobe lung nodule appreciated there was not present on previous chest x-rays a CT scan of the chest and PET scan was performed. The patient's case was discussed in multidisciplinary thoracic oncology clinic, and films were reviewed. The patient is referred to the surgical clinic to discuss treatment options.  He has known underlying emphysema with long-term smoking up to 40 years recently he has decreased to 5-6 cigarettes a day. He's had no past history of myocardial infarction 3-4 years ago he underwent cardiac catheterization in [facility] and was told that he had some minor coronary disease but nothing requiring invasive treatment.   Since last seen the patient had the pulmonary function studies performed and saw Dr. Excell Seltzer for cardiac clearance and cardiac stress test. He has not been smoking for the past 2 weeks.  Current Activity/ Functional Status: Patient is independent with mobility/ambulation, transfers, ADL's, IADL's.   Past Medical History  Diagnosis Date  . Hypertension   . Emphysema   . Hyperlipidemia   . Allergic rhinitis     Past Surgical  History  Procedure Date  . Eye muscle surgery x3     2 left eye, 1 right eye  . Knee surgery, left   . Back surgery     Family History  Problem Relation Age of Onset  . Emphysema Mother   . Allergies Brother   . Allergies Sister     x2  . Heart disease Sister Died MI age 7 26  . Heart failure Father Died 76 MI  . Heart disease Mother Died 80 MI  . Esophageal cancer Cousin   . Stomach cancer Cousin   . Lymphoma Cousin     History   Social History  . Marital Status: Married    Spouse Name: N/A    Number of Children: N/A  . Years of Education: N/A   Occupational History  . RETIRED     Journalist, newspaper, Paramedic   Social History Main Topics  . Smoking status: Current Everyday Smoker -- 2.0 packs/day for 40 years    Types: Cigarettes  . Smokeless tobacco: Not on file  . Alcohol Use: Yes     rare ETOH use  . Drug Use: No  . Sexually Active: Not on file      History  Smoking status  . Former Smoker -- 2.0 packs/day for 40 years  . Types: Cigarettes  . Quit date: 04/18/2011  Smokeless tobacco  . Not  on file    History  Alcohol Use  . Yes    rare ETOH use     Allergies  Allergen Reactions  . Avelox (Moxifloxacin Hcl In Nacl) Rash  . Codeine Rash    Current Outpatient Prescriptions  Medication Sig Dispense Refill  . aspirin 81 MG tablet Take 81 mg by mouth 2 (two) times daily.       . Aspirin-Acetaminophen-Caffeine (GOODY HEADACHE PO) Take 1-2 packets by mouth 2 (two) times daily as needed. As needed for back pain & headaches.       [name] BENICAR HCT 40-12.5 MG per tablet Once daily      . budesonide-formoterol (SYMBICORT) 160-4.5 MCG/ACT inhaler Inhale 2 puffs into the lungs 2 (two) times daily.      . CRESTOR 10 MG tablet Take 10 mg by mouth daily. Once daily      . Multiple Vitamin (MULITIVITAMIN WITH MINERALS) TABS Take 1 tablet by mouth daily.      [name] omeprazole (PRILOSEC) 20 MG capsule Take 20 mg by mouth daily. Once daily      . VENTOLIN HFA  108 (90 BASE) MCG/ACT inhaler Inhale 2 puffs into the lungs every 6 (six) hours as needed. For shortness of breath           Review of Systems:     Cardiac Review of Systems: Y or N  Chest Pain [ y rt shoulder   ]  Resting SOB [  n ] Exertional SOB  Cove.Etienne  ]  Orthopnea [ y ]   Pedal Edema [ n  ]    Palpitations [n  ] Syncope  [ n ]   Presyncope [ n  ]  General Review of Systems: [Y] = yes [  ]=no Constitional: recent weight change [ y ]; anorexia [ n ]; fatigue Cove.Etienne  ]; nausea [  n ]; night sweats [n  ]; fever [ n ]; or chills [ n ];                                                                                                                                          Dental: poor dentition[  ]; Last Dentist visit:   Eye : blurred vision [  ]; diplopia [   ]; vision changes [  ];  Amaurosis fugax[  ]; Resp: cough [ y ];  wheezing[ y ];  hemoptysis[ n ]; shortness of breath[ y ]; paroxysmal nocturnal dyspnea[  n]; dyspnea on exertion[y  ]; or orthopnea[n  ];  GI:  gallstones[  ], vomiting[  ];  dysphagia[  ]; melena[  ];  hematochezia [  n]; heartburn[  ];   Hx of  Colonoscopy[  ]; GU: kidney stones [  ]; hematuria[  ];   dysuria [  ];  nocturia[  ];  history of     obstruction [  ];  Skin: rash, swelling[  ];, hair loss[  ];  peripheral edema[  ];  or itching[  ]; Musculosketetal: myalgias[  ];  joint swelling[  ];  joint erythema[  ];  joint pain[ y ];  back pain[y  ];  Heme/Lymph: bruising[  ];  bleeding[  ];  anemia[  ];  Neuro: TIA[  ];  headaches[  ];  stroke[  ];  vertigo[  ];  seizures[  ];   paresthesias[  ];  difficulty walking[  ];  Psych:depression[  ]; anxiety[  ];  Endocrine: diabetes[  ];  thyroid dysfunction[  ];  Immunizations: Flu [ n ]; Pneumococcal[  n];  Other:  Physical Exam: BP 123/82  Pulse 76  Resp 16  Ht 5\' 6"  (1.676 m)  Wt 132 lb (59.875 kg)  BMI 21.31 kg/m2  SpO2 97%  General appearance: alert, cooperative, appears older than stated age and no  distress Neurologic: intact Heart: regular rate and rhythm, S1, S2 normal, no murmur, click, rub or gallop Lungs: diminished breath sounds bibasilar Abdomen: soft, non-tender; bowel sounds normal; no masses,  no organomegaly Extremities: extremities normal, atraumatic, no cyanosis or edema and Homans sign is negative, no sign of DVT No cervical or supraclavicular adenopathy is appreciated   Diagnostic Studies & Laboratory data:     Recent Radiology Findings:  Rest Procedure: Myocardial perfusion imaging was performed at rest 45 minutes following the intravenous administration of Technetium 80m Tetrofosmin.  Rest ECG: SB  Stress Procedure: The patient exercised for 11:30 mins. The patient stopped due to leg fatigue/mild sob and denied any chest pain. There were no significant ST-T wave changes/occ PVCs/PACs. Technetium 69m Tetrofosmin was injected at peak exercise and myocardial perfusion imaging was performed after a brief delay.  Stress ECG: No significant change from baseline ECG  QPS  Raw Data Images: Patient motion noted; appropriate software correction applied.  Stress Images: Normal homogeneous uptake in all areas of the myocardium.  Rest Images: Normal homogeneous uptake in all areas of the myocardium.  Subtraction (SDS): No evidence of ischemia.  Transient Ischemic Dilatation (Normal <1.22): 1.09  Lung/Heart Ratio (Normal <0.45): 0.31  Quantitative Gated Spect Images  QGS EDV: 104 ml  QGS ESV: 43 ml  QGS cine images: Normal Wall Motion  QGS EF: 58%  Impression  Exercise Capacity: Good exercise capacity.  BP Response: Normal blood pressure response.  Clinical Symptoms: No chest pain.  ECG Impression: No significant ST segment change suggestive of ischemia.  Comparison with Prior Nuclear Study: No images to compare  Overall Impression: Normal stress nuclear study.  Willa Rough, MD   Mr Brain W Wo Contrast  04/24/2011  *RADIOLOGY REPORT*  Clinical Data: Lung mass.  Rule  out metastatic disease.  BUN and creatinine were obtained on site at [facility] at [address] Results:  BUN 10 mg/dL,  Creatinine 1.1 mg/dL.  MRI HEAD WITHOUT AND WITH CONTRAST  Technique:  Multiplanar, multiecho pulse sequences of the brain and surrounding structures were obtained according to standard protocol without and with intravenous contrast  Contrast: 12mL MULTIHANCE GADOBENATE DIMEGLUMINE 529 MG/ML IV SOLN  Comparison: CT head 07/01/2007  Findings: Ventricle size is normal.  Negative for acute infarct. Scattered small nonenhancing hyperintensities in the subcortical white matter of the frontal lobes bilaterally, likely related to chronic microvascular ischemia.  Brainstem and cerebellum are intact.  Negative for hemorrhage or mass.  No enhancing lesions are identified post contrast.  Negative for metastatic disease.  Mucosal thickening in  the base of the maxillary antra bilaterally.  IMPRESSION: Negative for metastatic disease.  Mild chronic microvascular ischemic changes in the white matter.  Original Report Authenticated By: Camelia Phenes, M.D.   Nm Pet Image Initial (pi) Skull Base To Thigh  04/10/2011  *RADIOLOGY REPORT*  Clinical Data: Initial treatment strategy for abnormal chest CT, demonstrating a right upper lobe 2.5 cm nodule.  NUCLEAR MEDICINE PET CT SKULL BASE TO THIGH  Technique:  17.8 mCi F-18 FDG was injected intravenously via the right AC.  Full-ring PET imaging was performed from the skull base through the mid-thighs 60  minutes after injection.  CT data was obtained and used for attenuation correction and anatomic localization only.  (This was not acquired as a diagnostic CT examination.)  Fasting Blood Glucose:  94  Patient Weight:  128 pounds.  Comparison: Chest CT of 04/01/2011  Findings: PET images demonstrate extensive muscular activity throughout the neck and upper chest.  The right apical lung nodule primarily demonstrates low level, non malignant range  hypermetabolism.  For example, this measures a S.U.V. max of 1.8 on image 61.  Along its cephalad aspect, where it contacts the pleural surface, an area of more moderate hypermetabolism measures a S.U.V. max of 2.8, including on image 58.  No abnormal activity within the nodal stations of the mediastinum or hilum.  No abnormal activity within the abdomen or pelvis.  CT images performed for attenuation correction demonstrate right maxillary sinus mucous retention cyst or polyp.  Advanced carotid atherosclerosis.  Chest findings deferred to recent diagnostic chest CT.  Coronary artery atherosclerosis, including likely within the left main coronary artery on image 99.  No acute superimposed process identified.  There is moderate to marked centrilobular emphysema. Lumbosacral spine fixation.  IMPRESSION:  1.  The right apical lung nodule demonstrates primarily low level nonmalignant range hypermetabolism.  A portion of its peripheral/pleural-based component demonstrates borderline malignant range hypermetabolism.  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  Tissue sampling should be considered. 2.  No evidence of hypermetabolic thoracic nodal or extrathoracic disease.  Given the relative low level activity of the suspicious right apical nodule, PET may be of decreased sensitivity for nodal metastasis. 3.  Coronary artery disease, including likely within the left main.  Original Report Authenticated By: Consuello Bossier, M.D.    Nm Pet Image Initial (pi) Skull Base To Thigh  04/10/2011  *RADIOLOGY REPORT*  Clinical Data: Initial treatment strategy for abnormal chest CT, demonstrating a right upper lobe 2.5 cm nodule.  NUCLEAR MEDICINE PET CT SKULL BASE TO THIGH  Technique:  17.8 mCi F-18 FDG was injected intravenously via the right AC.  Full-ring PET imaging was performed from the skull base through the mid-thighs 60   minutes after injection.  CT data was obtained and used for attenuation correction and anatomic localization only.  (This was not acquired as a diagnostic CT examination.)  Fasting Blood Glucose:  94  Patient Weight:  128 pounds.  Comparison: Chest CT of 04/01/2011  Findings: PET images demonstrate extensive muscular activity throughout the neck and upper chest.  The right apical lung nodule primarily demonstrates low level, non malignant range hypermetabolism.  For example, this measures a S.U.V. max of 1.8 on image 61.  Along its cephalad aspect, where it contacts the pleural surface, an area of more moderate hypermetabolism measures a S.U.V. max of 2.8, including on image 58.  No abnormal activity within the nodal stations of the mediastinum or hilum.  No abnormal activity within the abdomen or pelvis.  CT images performed for attenuation correction demonstrate right maxillary sinus mucous retention cyst or polyp.  Advanced carotid atherosclerosis.  Chest findings deferred to recent diagnostic chest CT.  Coronary artery atherosclerosis, including likely within the left main coronary artery on image 99.  No acute superimposed process identified.  There is moderate to marked centrilobular emphysema. Lumbosacral spine fixation.  IMPRESSION:  1.  The right apical lung nodule demonstrates primarily low level nonmalignant range hypermetabolism.  A portion of its peripheral/pleural-based component demonstrates borderline malignant range hypermetabolism.  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  Tissue sampling should be considered. 2.  No evidence of hypermetabolic thoracic nodal or extrathoracic disease.  Given the relative low level activity of the suspicious right apical nodule, PET may be of decreased sensitivity for nodal metastasis. 3.  Coronary artery disease, including likely within the left main.   Original Report Authenticated By: Consuello Bossier, M.D.     Recent Lab Findings: No results found for this basename: WBC,  HGB,  HCT,  PLT,  GLUCOSE,  CHOL,  TRIG,  HDL,  LDLDIRECT,  LDLCALC,  ALT,  AST,  NA,  K,  CL,  CREATININE,  BUN,  CO2,  TSH,  INR,  GLUF,  HGBA1C   Full PFT done FEV1 1.89 60% FVC 3.33 80%  Diffusion 90% predicted   Assessment / Plan:     Patient is a 61 year old smoker which radiographically presents with a new right upper lobe lung mass, that was not present on chest x-rays from 2009 in 2011 in addition a CT scan of the neck did not show any right upper lobe lung mass. The mass is not significantly metabolically active on PET scan. Because of the new onset of the mass in a long-term smoker I recommended to the patient that we proceed with surgical resection. Needle biopsy or EnG was discussed with the patient however was felt that since the lesion was new even a negative biopsy would not prevent proceeding with resection.  The risks and options of VATS and lung resection are discussed with the patient and his wife in detail. He will return next week after preoperative studies are completed to set a definite surgical date  The goals risks and alternatives of the planned surgical procedure Bronchscopy and rt upper lung resection  have been discussed with the patient in detail. The risks of the procedure including death, infection, stroke, myocardial infarction, bleeding, blood transfusion and prolonged air leak have all been discussed specifically.  I have quoted Caleb [name] a 4% of perioperative mortality and a complication rate as high as 15 %. The patient's questions have been answered.Caleb [name] is willing  to proceed with the planned procedure.  Plan to proceed Feb6    Delight Ovens MD  Beeper [phone] Office [phone] 05/02/2011 12:15 PM

## 2011-05-06 ENCOUNTER — Encounter (HOSPITAL_COMMUNITY)
Admission: RE | Admit: 2011-05-06 | Discharge: 2011-05-06 | Disposition: A | Discharge status: Home / Self Care | Payer: 59 | Source: Ambulatory Visit | Attending: Family Medicine | Admitting: Family Medicine

## 2011-05-06 ENCOUNTER — Ambulatory Visit (HOSPITAL_COMMUNITY)
Admission: RE | Admit: 2011-05-06 | Discharge: 2011-05-06 | Disposition: A | Discharge status: Home / Self Care | Payer: 59 | Source: Ambulatory Visit | Attending: Cardiothoracic Surgery | Admitting: Cardiothoracic Surgery

## 2011-05-06 ENCOUNTER — Other Ambulatory Visit: Payer: Self-pay

## 2011-05-06 ENCOUNTER — Encounter (HOSPITAL_COMMUNITY): Payer: Self-pay

## 2011-05-06 ENCOUNTER — Other Ambulatory Visit (HOSPITAL_COMMUNITY): Payer: Self-pay | Admitting: *Deleted

## 2011-05-06 DIAGNOSIS — Z01812 Encounter for preprocedural laboratory examination: Secondary | ICD-10-CM | POA: Insufficient documentation

## 2011-05-06 DIAGNOSIS — Z01818 Encounter for other preprocedural examination: Secondary | ICD-10-CM | POA: Insufficient documentation

## 2011-05-06 DIAGNOSIS — D381 Neoplasm of uncertain behavior of trachea, bronchus and lung: Secondary | ICD-10-CM

## 2011-05-06 DIAGNOSIS — J984 Other disorders of lung: Secondary | ICD-10-CM | POA: Insufficient documentation

## 2011-05-06 DIAGNOSIS — Z01810 Encounter for preprocedural cardiovascular examination: Secondary | ICD-10-CM | POA: Insufficient documentation

## 2011-05-06 HISTORY — DX: Headache: R51

## 2011-05-06 HISTORY — DX: Chronic obstructive pulmonary disease, unspecified: J44.9

## 2011-05-06 HISTORY — DX: Acute upper respiratory infection, unspecified: J06.9

## 2011-05-06 HISTORY — DX: Unspecified hearing loss, unspecified ear: H91.90

## 2011-05-06 HISTORY — DX: Atherosclerotic heart disease of native coronary artery without angina pectoris: I25.10

## 2011-05-06 HISTORY — DX: Gastro-esophageal reflux disease without esophagitis: K21.9

## 2011-05-06 HISTORY — DX: Shortness of breath: R06.02

## 2011-05-06 LAB — CBC
HCT: 38.7 % — ABNORMAL LOW (ref 39.0–52.0)
Hemoglobin: 13.7 g/dL (ref 13.0–17.0)
MCH: 32.5 pg (ref 26.0–34.0)
MCHC: 35.4 g/dL (ref 30.0–36.0)
MCV: 91.9 fL (ref 78.0–100.0)
Platelets: 245 10*3/uL (ref 150–400)
RBC: 4.21 MIL/uL — ABNORMAL LOW (ref 4.22–5.81)
RDW: 13.4 % (ref 11.5–15.5)
WBC: 7.2 10*3/uL (ref 4.0–10.5)

## 2011-05-06 LAB — COMPREHENSIVE METABOLIC PANEL
ALT: 22 U/L (ref 0–53)
AST: 28 U/L (ref 0–37)
Albumin: 4 g/dL (ref 3.5–5.2)
Alkaline Phosphatase: 63 U/L (ref 39–117)
BUN: 10 mg/dL (ref 6–23)
CO2: 24 mEq/L (ref 19–32)
Calcium: 9.4 mg/dL (ref 8.4–10.5)
Chloride: 104 mEq/L (ref 96–112)
Creatinine, Ser: 0.83 mg/dL (ref 0.50–1.35)
GFR calc Af Amer: >90 mL/min (ref >90)
GFR calc non Af Amer: >90 mL/min (ref >90)
Glucose, Bld: 107 mg/dL — ABNORMAL HIGH (ref 70–99)
Potassium: 3.6 mEq/L (ref 3.5–5.1)
Sodium: 139 mEq/L (ref 135–145)
Total Bilirubin: 0.2 mg/dL — ABNORMAL LOW (ref 0.3–1.2)
Total Protein: 7 g/dL (ref 6.0–8.3)

## 2011-05-06 LAB — URINALYSIS, ROUTINE W REFLEX MICROSCOPIC
Bilirubin Urine: NEGATIVE
Glucose, UA: NEGATIVE mg/dL
Hgb urine dipstick: NEGATIVE
Ketones, ur: NEGATIVE mg/dL
Leukocytes, UA: NEGATIVE
Nitrite: NEGATIVE
Protein, ur: NEGATIVE mg/dL
Specific Gravity, Urine: 1.017 (ref 1.005–1.030)
Urobilinogen, UA: 0.2 mg/dL (ref 0.0–1.0)
pH: 6 (ref 5.0–8.0)

## 2011-05-06 LAB — BLOOD GAS, ARTERIAL
Acid-Base Excess: 0.8 mmol/L (ref 0.0–2.0)
Bicarbonate: 24.5 mEq/L — ABNORMAL HIGH (ref 20.0–24.0)
Drawn by: 344381
FIO2: 0.21 %
O2 Saturation: 94.7 %
Patient temperature: 98.6
TCO2: 25.6 mmol/L (ref 0–100)
pCO2 arterial: 36.1 mmHg (ref 35.0–45.0)
pH, Arterial: 7.446 (ref 7.350–7.450)
pO2, Arterial: 68.7 mmHg — ABNORMAL LOW (ref 80.0–100.0)

## 2011-05-06 LAB — PROTIME-INR
INR: 0.94 (ref 0.00–1.49)
Prothrombin Time: 12.8 seconds (ref 11.6–15.2)

## 2011-05-06 LAB — SURGICAL PCR SCREEN
MRSA, PCR: NEGATIVE
Staphylococcus aureus: NEGATIVE

## 2011-05-06 LAB — APTT: aPTT: 28 seconds (ref 24–37)

## 2011-05-06 LAB — TYPE AND SCREEN
ABO/RH(D): B NEG
Antibody Screen: NEGATIVE

## 2011-05-06 LAB — ABO/RH: ABO/RH(D): B NEG

## 2011-05-06 NOTE — Pre-Procedure Instructions (Signed)
20 Caleb [name]  05/06/2011   Your procedure is scheduled on:  Wednesday, FEB 6 th  Report to [facility] at   6:30 AM.  Call this number if you have problems the morning of surgery: [phone]   Remember:   Do not eat food:After Midnight Tuesday.  May have clear liquids: up to 4 Hours before arrival time-- 2:30 AM.   Clear liquids include soda, tea, black coffee, apple or grape juice, broth.   Take these medicines the morning of surgery with A SIP OF WATER: SYMBICORT,              PRILOSEC, VENTOLIN   Do not wear jewelry, make-up or nail polish.   Do not wear lotions, powders, or perfumes. You may wear deodorant.    Do not bring valuables to the hospital.   Contacts, dentures or bridgework may not be worn into surgery.  Leave suitcase in the car. After surgery it may be brought to your room.  For patients admitted to the hospital, checkout time is 11:00 AM the day of discharge.   Patients discharged the day of surgery will not be allowed to drive home.  Name and phone number of your driver: MARY Rennaker -- SPOUSE  Special Instructions: CHG Shower Use Special Wash: 1/2 bottle night before surgery and 1/2 bottle morning of surgery.   Please read over the following fact sheets that you were given: Pain Booklet, Blood Transfusion Information, MRSA Information and Surgical Site Infection Prevention

## 2011-05-07 MED ORDER — VERAPAMIL HCL 2.5 MG/ML IV SOLN
INTRAVENOUS | Status: AC
  Filled 2011-05-07: qty 2.5

## 2011-05-07 MED ORDER — DOPAMINE-DEXTROSE 3.2-5 MG/ML-% IV SOLN
2.0000 ug/kg/min | INTRAVENOUS | Status: AC
  Filled 2011-05-07: qty 250

## 2011-05-07 MED ORDER — NITROGLYCERIN IN D5W 200-5 MCG/ML-% IV SOLN
2.0000 ug/min | INTRAVENOUS | Status: AC
  Filled 2011-05-07: qty 250

## 2011-05-07 MED ORDER — DEXTROSE 5 % IV SOLN
1.5000 g | INTRAVENOUS | Status: AC
  Administered 2011-05-08: 1.5 g via INTRAVENOUS
  Filled 2011-05-07: qty 1.5

## 2011-05-07 MED ORDER — DEXTROSE 5 % IV SOLN
1.5000 g | INTRAVENOUS | Status: AC
  Filled 2011-05-07: qty 1.5

## 2011-05-07 MED ORDER — SODIUM CHLORIDE 0.9 % IV SOLN
INTRAVENOUS | Status: AC
  Filled 2011-05-07: qty 1

## 2011-05-07 MED ORDER — SODIUM CHLORIDE 0.9 % IV SOLN
0.1000 ug/kg/h | INTRAVENOUS | Status: DC
  Filled 2011-05-07: qty 4

## 2011-05-07 MED ORDER — POTASSIUM CHLORIDE 2 MEQ/ML IV SOLN
80.0000 meq | INTRAVENOUS | Status: AC
  Filled 2011-05-07: qty 40

## 2011-05-07 MED ORDER — SODIUM CHLORIDE 0.9 % IV SOLN
INTRAVENOUS | Status: AC
  Filled 2011-05-07: qty 40

## 2011-05-07 MED ORDER — VANCOMYCIN HCL 1000 MG IV SOLR
1250.0000 mg | INTRAVENOUS | Status: AC
  Filled 2011-05-07: qty 1250

## 2011-05-07 MED ORDER — EPINEPHRINE HCL 1 MG/ML IJ SOLN
0.5000 ug/min | INTRAMUSCULAR | Status: AC
  Filled 2011-05-07: qty 4

## 2011-05-07 MED ORDER — MAGNESIUM SULFATE 50 % IJ SOLN
40.0000 meq | INTRAMUSCULAR | Status: AC
  Filled 2011-05-07: qty 10

## 2011-05-07 MED ORDER — DEXTROSE 5 % IV SOLN
750.0000 mg | INTRAVENOUS | Status: AC
  Filled 2011-05-07: qty 750

## 2011-05-07 MED ORDER — PHENYLEPHRINE HCL 10 MG/ML IJ SOLN
30.0000 ug/min | INTRAVENOUS | Status: AC
  Filled 2011-05-07: qty 2

## 2011-05-08 ENCOUNTER — Encounter (HOSPITAL_COMMUNITY): Payer: Self-pay | Admitting: Certified Registered"

## 2011-05-08 ENCOUNTER — Encounter (HOSPITAL_COMMUNITY)
Admission: RE | Disposition: A | Discharge status: Home / Self Care | Payer: Self-pay | Source: Ambulatory Visit | Attending: Cardiothoracic Surgery

## 2011-05-08 ENCOUNTER — Ambulatory Visit (HOSPITAL_COMMUNITY): Payer: 59

## 2011-05-08 ENCOUNTER — Inpatient Hospital Stay (HOSPITAL_COMMUNITY)
Admission: RE | Admit: 2011-05-08 | Discharge: 2011-05-14 | DRG: 164 | Disposition: A | Discharge status: Home / Self Care | Payer: 59 | Source: Ambulatory Visit | Attending: Cardiothoracic Surgery | Admitting: Cardiothoracic Surgery

## 2011-05-08 ENCOUNTER — Ambulatory Visit (HOSPITAL_COMMUNITY): Payer: 59 | Admitting: Certified Registered"

## 2011-05-08 ENCOUNTER — Encounter (HOSPITAL_COMMUNITY): Payer: Self-pay | Admitting: *Deleted

## 2011-05-08 ENCOUNTER — Other Ambulatory Visit: Payer: Self-pay | Admitting: Cardiothoracic Surgery

## 2011-05-08 DIAGNOSIS — J438 Other emphysema: Secondary | ICD-10-CM | POA: Diagnosis present

## 2011-05-08 DIAGNOSIS — A158 Other respiratory tuberculosis: Principal | ICD-10-CM | POA: Diagnosis present

## 2011-05-08 DIAGNOSIS — J449 Chronic obstructive pulmonary disease, unspecified: Secondary | ICD-10-CM | POA: Insufficient documentation

## 2011-05-08 DIAGNOSIS — D381 Neoplasm of uncertain behavior of trachea, bronchus and lung: Secondary | ICD-10-CM

## 2011-05-08 DIAGNOSIS — R222 Localized swelling, mass and lump, trunk: Secondary | ICD-10-CM | POA: Insufficient documentation

## 2011-05-08 DIAGNOSIS — J95811 Postprocedural pneumothorax: Secondary | ICD-10-CM | POA: Diagnosis not present

## 2011-05-08 DIAGNOSIS — Z720 Tobacco use: Secondary | ICD-10-CM | POA: Insufficient documentation

## 2011-05-08 DIAGNOSIS — F17200 Nicotine dependence, unspecified, uncomplicated: Secondary | ICD-10-CM | POA: Diagnosis present

## 2011-05-08 DIAGNOSIS — I2510 Atherosclerotic heart disease of native coronary artery without angina pectoris: Secondary | ICD-10-CM | POA: Diagnosis present

## 2011-05-08 HISTORY — PX: VIDEO BRONCHOSCOPY: SHX5072

## 2011-05-08 SURGERY — BRONCHOSCOPY, VIDEO-ASSISTED
Anesthesia: General | Site: Chest | Laterality: Right | Wound class: Clean Contaminated

## 2011-05-08 MED ORDER — DIPHENHYDRAMINE HCL 50 MG/ML IJ SOLN
12.5000 mg | Freq: Four times a day (QID) | INTRAMUSCULAR | Status: DC | PRN
  Administered 2011-05-08 – 2011-05-11 (×7): 12.5 mg via INTRAVENOUS
  Filled 2011-05-08 (×6): qty 1

## 2011-05-08 MED ORDER — FENTANYL CITRATE 0.05 MG/ML IJ SOLN
25.0000 ug | INTRAMUSCULAR | Status: DC | PRN
  Administered 2011-05-08 (×2): 50 ug via INTRAVENOUS

## 2011-05-08 MED ORDER — ROSUVASTATIN CALCIUM 10 MG PO TABS
10.0000 mg | ORAL_TABLET | Freq: Every day | ORAL | Status: DC
  Administered 2011-05-08 – 2011-05-14 (×7): 10 mg via ORAL
  Filled 2011-05-08 (×7): qty 1

## 2011-05-08 MED ORDER — NICOTINE 14 MG/24HR TD PT24
14.0000 mg | MEDICATED_PATCH | Freq: Every day | TRANSDERMAL | Status: DC
  Administered 2011-05-08 – 2011-05-14 (×7): 14 mg via TRANSDERMAL
  Filled 2011-05-08 (×8): qty 1

## 2011-05-08 MED ORDER — FENTANYL 10 MCG/ML IV SOLN
INTRAVENOUS | Status: DC
  Administered 2011-05-08: 300 ug via INTRAVENOUS
  Administered 2011-05-08: 45 ug via INTRAVENOUS
  Administered 2011-05-09: 105 ug via INTRAVENOUS
  Administered 2011-05-09: 90 ug via INTRAVENOUS
  Administered 2011-05-09: 75 ug via INTRAVENOUS
  Administered 2011-05-09: 45 ug via INTRAVENOUS
  Administered 2011-05-09: 07:00:00 via INTRAVENOUS
  Administered 2011-05-09: 75 ug via INTRAVENOUS
  Administered 2011-05-09: 180 ug via INTRAVENOUS
  Administered 2011-05-10: 60 ug via INTRAVENOUS
  Administered 2011-05-10: 15 ug via INTRAVENOUS
  Administered 2011-05-10: 75 ug via INTRAVENOUS
  Administered 2011-05-10: 105 ug via INTRAVENOUS
  Administered 2011-05-10: 135 ug via INTRAVENOUS
  Administered 2011-05-10: 75 ug via INTRAVENOUS
  Administered 2011-05-11: 15 ug via INTRAVENOUS
  Administered 2011-05-11: 120 ug via INTRAVENOUS
  Administered 2011-05-11: 30 ug via INTRAVENOUS
  Administered 2011-05-11: 60 ug via INTRAVENOUS
  Administered 2011-05-11: 135 ug via INTRAVENOUS
  Administered 2011-05-11: 15 ug via INTRAVENOUS
  Filled 2011-05-08 (×3): qty 50

## 2011-05-08 MED ORDER — MEPERIDINE HCL 25 MG/ML IJ SOLN: 6.2500 mg | INTRAMUSCULAR | Status: DC | PRN

## 2011-05-08 MED ORDER — FENTANYL CITRATE 0.05 MG/ML IJ SOLN: 25.0000 ug | INTRAMUSCULAR | Status: DC | PRN

## 2011-05-08 MED ORDER — ONDANSETRON HCL 4 MG/2ML IJ SOLN
INTRAMUSCULAR | Status: DC | PRN
  Administered 2011-05-08: 4 mg via INTRAVENOUS

## 2011-05-08 MED ORDER — PROMETHAZINE HCL 25 MG/ML IJ SOLN: 6.2500 mg | INTRAMUSCULAR | Status: DC | PRN

## 2011-05-08 MED ORDER — PHENYLEPHRINE HCL 10 MG/ML IJ SOLN
10.0000 mg | INTRAVENOUS | Status: DC | PRN
  Administered 2011-05-08: 10 ug/min via INTRAVENOUS

## 2011-05-08 MED ORDER — BUPIVACAINE ON-Q PAIN PUMP (FOR ORDER SET NO CHG)
INJECTION | Status: AC
  Filled 2011-05-08: qty 1

## 2011-05-08 MED ORDER — NALOXONE HCL 0.4 MG/ML IJ SOLN: 0.4000 mg | INTRAMUSCULAR | Status: DC | PRN

## 2011-05-08 MED ORDER — BUPIVACAINE HCL 0.5 % IJ SOLN
INTRAMUSCULAR | Status: DC | PRN
  Administered 2011-05-08: 30 mL

## 2011-05-08 MED ORDER — MIDAZOLAM HCL 5 MG/5ML IJ SOLN
INTRAMUSCULAR | Status: DC | PRN
  Administered 2011-05-08: 2 mg via INTRAVENOUS

## 2011-05-08 MED ORDER — MEPERIDINE HCL 25 MG/ML IJ SOLN
6.2500 mg | INTRAMUSCULAR | Status: DC | PRN
  Administered 2011-05-08 (×2): 12.5 mg via INTRAVENOUS

## 2011-05-08 MED ORDER — ROCURONIUM BROMIDE 100 MG/10ML IV SOLN
INTRAVENOUS | Status: DC | PRN
  Administered 2011-05-08: 50 mg via INTRAVENOUS
  Administered 2011-05-08: 10 mg via INTRAVENOUS

## 2011-05-08 MED ORDER — LACTATED RINGERS IV SOLN
INTRAVENOUS | Status: DC | PRN
  Administered 2011-05-08 (×2): via INTRAVENOUS

## 2011-05-08 MED ORDER — SODIUM CHLORIDE 0.9 % IJ SOLN: 9.0000 mL | INTRAMUSCULAR | Status: DC | PRN

## 2011-05-08 MED ORDER — ADULT MULTIVITAMIN W/MINERALS CH
1.0000 | ORAL_TABLET | Freq: Every day | ORAL | Status: DC
  Administered 2011-05-09 – 2011-05-14 (×6): 1 via ORAL
  Filled 2011-05-08 (×6): qty 1

## 2011-05-08 MED ORDER — ASPIRIN 81 MG PO TABS: 81.0000 mg | ORAL_TABLET | Freq: Two times a day (BID) | ORAL | Status: DC

## 2011-05-08 MED ORDER — NEOSTIGMINE METHYLSULFATE 1 MG/ML IJ SOLN
INTRAMUSCULAR | Status: DC | PRN
  Administered 2011-05-08: 4 mg via INTRAVENOUS

## 2011-05-08 MED ORDER — DIPHENHYDRAMINE HCL 12.5 MG/5ML PO ELIX
12.5000 mg | ORAL_SOLUTION | Freq: Four times a day (QID) | ORAL | Status: DC | PRN
  Administered 2011-05-08: 12.5 mg via ORAL
  Filled 2011-05-08: qty 5

## 2011-05-08 MED ORDER — TRAMADOL HCL 50 MG PO TABS
50.0000 mg | ORAL_TABLET | Freq: Four times a day (QID) | ORAL | Status: DC | PRN
  Administered 2011-05-11 – 2011-05-14 (×5): 100 mg via ORAL
  Filled 2011-05-08 (×5): qty 2

## 2011-05-08 MED ORDER — ONDANSETRON HCL 4 MG/2ML IJ SOLN: 4.0000 mg | Freq: Four times a day (QID) | INTRAMUSCULAR | Status: DC | PRN

## 2011-05-08 MED ORDER — BUDESONIDE-FORMOTEROL FUMARATE 160-4.5 MCG/ACT IN AERO
2.0000 | INHALATION_SPRAY | Freq: Two times a day (BID) | RESPIRATORY_TRACT | Status: DC
  Administered 2011-05-08 – 2011-05-13 (×8): 2 via RESPIRATORY_TRACT
  Filled 2011-05-08: qty 6

## 2011-05-08 MED ORDER — ACETAMINOPHEN 10 MG/ML IV SOLN
1000.0000 mg | Freq: Four times a day (QID) | INTRAVENOUS | Status: AC
  Administered 2011-05-08 – 2011-05-09 (×4): 1000 mg via INTRAVENOUS
  Filled 2011-05-08 (×4): qty 100

## 2011-05-08 MED ORDER — GLYCOPYRROLATE 0.2 MG/ML IJ SOLN
INTRAMUSCULAR | Status: DC | PRN
  Administered 2011-05-08: .6 mg via INTRAVENOUS

## 2011-05-08 MED ORDER — FENTANYL CITRATE 0.05 MG/ML IJ SOLN
INTRAMUSCULAR | Status: DC | PRN
  Administered 2011-05-08: 100 ug via INTRAVENOUS
  Administered 2011-05-08 (×2): 50 ug via INTRAVENOUS
  Administered 2011-05-08: 100 ug via INTRAVENOUS
  Administered 2011-05-08 (×2): 50 ug via INTRAVENOUS

## 2011-05-08 MED ORDER — PROPOFOL 10 MG/ML IV EMUL
INTRAVENOUS | Status: DC | PRN
  Administered 2011-05-08: 20 mg via INTRAVENOUS
  Administered 2011-05-08: 200 mg via INTRAVENOUS

## 2011-05-08 MED ORDER — BUPIVACAINE 0.5 % ON-Q PUMP SINGLE CATH 400 ML
400.0000 mL | INJECTION | Status: AC
  Administered 2011-05-08: 400 mL
  Filled 2011-05-08: qty 400

## 2011-05-08 MED ORDER — ASPIRIN 81 MG PO CHEW
81.0000 mg | CHEWABLE_TABLET | Freq: Two times a day (BID) | ORAL | Status: DC
  Administered 2011-05-08 – 2011-05-14 (×12): 81 mg via ORAL
  Filled 2011-05-08 (×12): qty 1

## 2011-05-08 MED ORDER — FENTANYL 10 MCG/ML IV SOLN
INTRAVENOUS | Status: DC
  Administered 2011-05-08: 13:00:00 via INTRAVENOUS
  Filled 2011-05-08: qty 50

## 2011-05-08 MED ORDER — 0.9 % SODIUM CHLORIDE (POUR BTL) OPTIME
TOPICAL | Status: DC | PRN
  Administered 2011-05-08: 3000 mL

## 2011-05-08 MED ORDER — DEXTROSE 5 % IV SOLN
1.5000 g | Freq: Two times a day (BID) | INTRAVENOUS | Status: AC
  Administered 2011-05-08 – 2011-05-09 (×2): 1.5 g via INTRAVENOUS
  Filled 2011-05-08 (×2): qty 1.5

## 2011-05-08 MED ORDER — POTASSIUM CHLORIDE 10 MEQ/50ML IV SOLN: 10.0000 meq | Freq: Every day | INTRAVENOUS | Status: DC | PRN

## 2011-05-08 MED ORDER — SODIUM CHLORIDE 0.9 % IV SOLN: 0.1000 ug/kg/h | INTRAVENOUS | Status: DC

## 2011-05-08 MED ORDER — POTASSIUM CHLORIDE IN NACL 20-0.9 MEQ/L-% IV SOLN
INTRAVENOUS | Status: DC
  Administered 2011-05-08 – 2011-05-10 (×3): via INTRAVENOUS
  Administered 2011-05-12: 20 mL via INTRAVENOUS
  Filled 2011-05-08 (×7): qty 1000

## 2011-05-08 MED ORDER — SENNOSIDES-DOCUSATE SODIUM 8.6-50 MG PO TABS
1.0000 | ORAL_TABLET | Freq: Every evening | ORAL | Status: DC | PRN
  Filled 2011-05-08: qty 1

## 2011-05-08 MED ORDER — MEPERIDINE HCL 25 MG/ML IJ SOLN
INTRAMUSCULAR | Status: AC
  Filled 2011-05-08: qty 1

## 2011-05-08 MED ORDER — BISACODYL 5 MG PO TBEC
10.0000 mg | DELAYED_RELEASE_TABLET | Freq: Every day | ORAL | Status: DC
  Administered 2011-05-08 – 2011-05-13 (×4): 10 mg via ORAL
  Filled 2011-05-08 (×2): qty 2
  Filled 2011-05-08: qty 1
  Filled 2011-05-08 (×3): qty 2

## 2011-05-08 MED ORDER — ALBUTEROL SULFATE HFA 108 (90 BASE) MCG/ACT IN AERS
2.0000 | INHALATION_SPRAY | Freq: Four times a day (QID) | RESPIRATORY_TRACT | Status: DC | PRN
  Filled 2011-05-08: qty 6.7

## 2011-05-08 MED ORDER — LACTATED RINGERS IV SOLN: INTRAVENOUS | Status: DC

## 2011-05-08 SURGICAL SUPPLY — 73 items
APPLICATOR TIP COSEAL (VASCULAR PRODUCTS) IMPLANT
APPLICATOR TIP EXT COSEAL (VASCULAR PRODUCTS) IMPLANT
APPLIER CLIP ROT 10 11.4 M/L (STAPLE) ×3
BLADE SURG 11 STRL SS (BLADE) ×3 IMPLANT
BRUSH CYTOL CELLEBRITY 1.5X140 (MISCELLANEOUS) IMPLANT
CANISTER SUCTION 2500CC (MISCELLANEOUS) ×3 IMPLANT
CATH KIT ON Q 5IN SLV (PAIN MANAGEMENT) IMPLANT
CATH THORACIC 28FR (CATHETERS) IMPLANT
CATH THORACIC 36FR (CATHETERS) IMPLANT
CATH THORACIC 36FR RT ANG (CATHETERS) IMPLANT
CLIP APPLIE ROT 10 11.4 M/L (STAPLE) ×2 IMPLANT
CLIP TI MEDIUM 6 (CLIP) ×3 IMPLANT
CLOTH BEACON ORANGE TIMEOUT ST (SAFETY) ×3 IMPLANT
CONT SPEC 4OZ CLIKSEAL STRL BL (MISCELLANEOUS) ×6 IMPLANT
COVER TABLE BACK 60X90 (DRAPES) ×3 IMPLANT
DRAPE LAPAROSCOPIC ABDOMINAL (DRAPES) ×3 IMPLANT
DRAPE SLUSH MACHINE 52X66 (DRAPES) IMPLANT
DRAPE SLUSH/WARMER DISC (DRAPES) IMPLANT
DRILL BIT 7/64X5 (BIT) ×6 IMPLANT
ELECT BLADE 4.0 EZ CLEAN MEGAD (MISCELLANEOUS) ×3
ELECT REM PT RETURN 9FT ADLT (ELECTROSURGICAL) ×3
ELECTRODE BLDE 4.0 EZ CLN MEGD (MISCELLANEOUS) ×2 IMPLANT
ELECTRODE REM PT RTRN 9FT ADLT (ELECTROSURGICAL) ×2 IMPLANT
FORCEPS BIOP RJ4 1.8 (CUTTING FORCEPS) IMPLANT
GLOVE BIO SURGEON STRL SZ 6.5 (GLOVE) ×6 IMPLANT
GOWN STRL NON-REIN LRG LVL3 (GOWN DISPOSABLE) ×12 IMPLANT
HANDLE STAPLE ENDO GIA SHORT (STAPLE) ×1
KIT BASIN OR (CUSTOM PROCEDURE TRAY) ×3 IMPLANT
KIT ROOM TURNOVER OR (KITS) ×3 IMPLANT
KIT SUCTION CATH 14FR (SUCTIONS) ×3 IMPLANT
MARKER SKIN DUAL TIP RULER LAB (MISCELLANEOUS) ×3 IMPLANT
NEEDLE BIOPSY TRANSBRONCH 21G (NEEDLE) IMPLANT
NS IRRIG 1000ML POUR BTL (IV SOLUTION) ×6 IMPLANT
OIL SILICONE PENTAX (PARTS (SERVICE/REPAIRS)) ×3 IMPLANT
PACK CHEST (CUSTOM PROCEDURE TRAY) ×3 IMPLANT
PAD ARMBOARD 7.5X6 YLW CONV (MISCELLANEOUS) ×6 IMPLANT
RELOAD EGIA 45 MED/THCK PURPLE (STAPLE) ×12 IMPLANT
SCISSORS LAP 5X35 DISP (ENDOMECHANICALS) ×3 IMPLANT
SEALANT PROGEL (MISCELLANEOUS) IMPLANT
SEALANT SURG COSEAL 4ML (VASCULAR PRODUCTS) IMPLANT
SEALANT SURG COSEAL 8ML (VASCULAR PRODUCTS) IMPLANT
SOLUTION ANTI FOG 6CC (MISCELLANEOUS) IMPLANT
SPONGE GAUZE 4X4 12PLY (GAUZE/BANDAGES/DRESSINGS) ×6 IMPLANT
SPONGE TONSIL 1.25 RF SGL STRG (GAUZE/BANDAGES/DRESSINGS) ×6 IMPLANT
STAPLER ENDO GIA 12MM SHORT (STAPLE) ×2 IMPLANT
SUT PROLENE 3 0 SH DA (SUTURE) IMPLANT
SUT PROLENE 4 0 RB 1 (SUTURE)
SUT PROLENE 4-0 RB1 .5 CRCL 36 (SUTURE) IMPLANT
SUT SILK  1 MH (SUTURE) ×4
SUT SILK 1 MH (SUTURE) ×8 IMPLANT
SUT SILK 2 0 SH CR/8 (SUTURE) ×3 IMPLANT
SUT SILK 2 0SH CR/8 30 (SUTURE) IMPLANT
SUT SILK 3 0SH CR/8 30 (SUTURE) IMPLANT
SUT VIC AB 1 CTX 18 (SUTURE) ×3 IMPLANT
SUT VIC AB 1 CTX 36 (SUTURE)
SUT VIC AB 1 CTX36XBRD ANBCTR (SUTURE) IMPLANT
SUT VIC AB 2-0 CTX 36 (SUTURE) ×3 IMPLANT
SUT VIC AB 2-0 UR6 27 (SUTURE) IMPLANT
SUT VIC AB 3-0 SH 8-18 (SUTURE) ×3 IMPLANT
SUT VIC AB 3-0 X1 27 (SUTURE) ×3 IMPLANT
SUT VICRYL 2 TP 1 (SUTURE) ×3 IMPLANT
SWAB COLLECTION DEVICE MRSA (MISCELLANEOUS) IMPLANT
SYR 20ML ECCENTRIC (SYRINGE) ×3 IMPLANT
SYSTEM SAHARA CHEST DRAIN ATS (WOUND CARE) ×3 IMPLANT
TIP APPLICATOR SPRAY EXTEND 16 (VASCULAR PRODUCTS) IMPLANT
TOWEL OR 17X24 6PK STRL BLUE (TOWEL DISPOSABLE) ×3 IMPLANT
TOWEL OR 17X26 10 PK STRL BLUE (TOWEL DISPOSABLE) ×6 IMPLANT
TRAP SPECIMEN MUCOUS 40CC (MISCELLANEOUS) ×6 IMPLANT
TRAY FOLEY CATH 14FRSI W/METER (CATHETERS) ×3 IMPLANT
TUBE ANAEROBIC SPECIMEN COL (MISCELLANEOUS) IMPLANT
TUBE CONNECTING 12X1/4 (SUCTIONS) ×3 IMPLANT
TUNNELER SHEATH ON-Q 11GX8 (MISCELLANEOUS) ×3 IMPLANT
WATER STERILE IRR 1000ML POUR (IV SOLUTION) ×6 IMPLANT

## 2011-05-08 NOTE — Anesthesia Preprocedure Evaluation (Addendum)
Anesthesia Evaluation  Patient identified by MRN, date of birth, ID band Patient awake    Reviewed: Allergy & Precautions, H&P , NPO status , Patient's Chart, lab work & pertinent test results, reviewed documented beta blocker date and time   Airway Mallampati: I TM Distance: <3 FB Neck ROM: Full    Dental  (+) Edentulous Upper and Partial Lower   Pulmonary shortness of breath and with exertion, COPD COPD inhaler, Recent URI , Resolved,  clear to auscultation        Cardiovascular hypertension, Pt. on medications + CAD Regular Normal    Neuro/Psych  Headaches,    GI/Hepatic Neg liver ROS, GERD-  Medicated,  Endo/Other    Renal/GU negative Renal ROS     Musculoskeletal negative musculoskeletal ROS (+)   Abdominal (+)  Abdomen: soft. Bowel sounds: normal.  Peds  Hematology negative hematology ROS (+)   Anesthesia Other Findings   Reproductive/Obstetrics                        Anesthesia Physical Anesthesia Plan  ASA: III  Anesthesia Plan: General   Post-op Pain Management:    Induction: Intravenous  Airway Management Planned: Oral ETT  Additional Equipment: Arterial line and CVP  Intra-op Plan:   Post-operative Plan:   Informed Consent: I have reviewed the patients History and Physical, chart, labs and discussed the procedure including the risks, benefits and alternatives for the proposed anesthesia with the patient or authorized representative who has indicated his/her understanding and acceptance.   Dental advisory given  Plan Discussed with: CRNA  Anesthesia Plan Comments:         Anesthesia Quick Evaluation

## 2011-05-08 NOTE — Transfer of Care (Signed)
Immediate Anesthesia Transfer of Care Note  Patient: Caleb [name]  Procedure(s) Performed:  VIDEO BRONCHOSCOPY; VIDEO ASSISTED THORACOSCOPY (VATS)/WEDGE RESECTION - RUL RESECTION  Patient Location: PACU  Anesthesia Type: General  Level of Consciousness: awake  Airway & Oxygen Therapy: Patient Spontanous Breathing  Post-op Assessment: Report given to PACU RN  Post vital signs: stable  Complications: No apparent anesthesia complications

## 2011-05-08 NOTE — Brief Op Note (Signed)
05/08/2011  10:43 AM  PATIENT:  Lisbeth Renshaw  61 y.o. male  PRE-OPERATIVE DIAGNOSIS:  Right Upper Lobe Lung Nodule  POST-OPERATIVE DIAGNOSIS:  Right Upper Lobe Lung Nodule  PROCEDURE:  Procedure(s): VIDEO BRONCHOSCOPY RIGHT VIDEO ASSISTED THORACOSCOPY (VATS)/WEDGE RESECTION of RUL  SURGEON:  Delight Ovens, MD  PHYSICIAN ASSISTANT: Doree Fudge PA-C   ANESTHESIA:   general  EBL:  Total I/O In: 2000 [I.V.:2000] Out: 500 [Urine:500]  BLOOD ADMINISTERED:none  DRAINS: 1 Chest Tube(s) in the right pleural space   LOCAL MEDICATIONS USED:  BUPIVICAINE 3 CC  SPECIMEN:  Source of Specimen:  RUL wedge resection  DISPOSITION OF SPECIMEN:  PATHOLOGY. Frozen section likely inflammatory.  COUNTS CORRECT:  YES  DICTATION: .Dragon Dictation  PLAN OF CARE: Admit to inpatient   PATIENT DISPOSITION:  PACU - hemodynamically stable.   Delay start of Pharmacological VTE agent (>24hrs) due to surgical blood loss or risk of bleeding: YES

## 2011-05-08 NOTE — Anesthesia Postprocedure Evaluation (Signed)
  Anesthesia Post-op Note  Patient: Caleb [name]  Procedure(s) Performed:  VIDEO BRONCHOSCOPY; VIDEO ASSISTED THORACOSCOPY (VATS)/WEDGE RESECTION - RUL RESECTION  Patient Location: PACU  Anesthesia Type: General  Level of Consciousness: awake  Airway and Oxygen Therapy: Patient Spontanous Breathing  Post-op Pain: mild  Post-op Assessment: Post-op Vital signs reviewed  Post-op Vital Signs: stable  Complications: No apparent anesthesia complications

## 2011-05-08 NOTE — H&P (Signed)
[address]            [place] [address]          [phone]    Caleb [name]  [facility] Medical Record #[id number]  Date of Birth: [birth year]  Referring: Leslye Peer., MD  Primary Care: Marylen Ponto, MD, MD  Chief Complaint:  Chief Complaint   Patient presents with   .  Lung Mass     further discussion regarding surgery after PFT'S, SURGICAL CLEARANCE, MRI BRAIN   History of Present Illness:  Patient is a 61 year old smoker who developed upper respiratory infection symptoms in November of 2012. The symptoms were predominantly right upper chest wall discomfort and cough. Patient did lose almost 20 pounds in weight. The symptoms improved and then returned again in December. At that time he saw his primary care doctor and a chest x-ray was obtained there was a new right upper lobe lung nodule appreciated there was not present on previous chest x-rays a CT scan of the chest and PET scan was performed.  The patient's case was discussed in multidisciplinary thoracic oncology clinic, and films were reviewed. The patient is referred to the surgical clinic to discuss treatment options.  He has known underlying emphysema with long-term smoking up to 40 years recently he has decreased to 5-6 cigarettes a day.  He's had no past history of myocardial infarction 3-4 years ago he underwent cardiac catheterization in [facility] and was told that he had some minor coronary disease but nothing requiring invasive treatment.  Since last seen the patient had the pulmonary function studies performed and saw Dr. Excell Seltzer for cardiac clearance and cardiac stress test.  He has not been smoking for the past 2 weeks.  Current Activity/ Functional Status:  Patient is independent with mobility/ambulation, transfers, ADL's, IADL's.  Past Medical History   Diagnosis  Date   .  Hypertension    .  Emphysema    .  Hyperlipidemia    .  Allergic rhinitis     Past Surgical  History   Procedure  Date   .  Eye muscle surgery x3      2 left eye, 1 right eye   .  Knee surgery, left    .  Back surgery     Family History   Problem  Relation  Age of Onset   .  Emphysema  Mother    .  Allergies  Brother    .  Allergies  Sister       x2    .  Heart disease  Sister  Died MI age 66 80   .  Heart failure  Father  Died 61 MI   .  Heart disease  Mother  Died 73 MI   .  Esophageal cancer  Cousin    .  Stomach cancer  Cousin    .  Lymphoma  Cousin     History    Social History   .  Marital Status:  Married     Spouse Name:  N/A     Number of Children:  N/A   .  Years of Education:  N/A    Occupational History   .  RETIRED      Journalist, newspaper, Paramedic    Social History Main Topics   .  Smoking status:  Current Everyday Smoker --  2.0 packs/day for 40 years     Types:  Cigarettes   .  Smokeless tobacco:  Not on file   .  Alcohol Use:  Yes      rare ETOH use   .  Drug Use:  No   .  Sexually Active:  Not on file    History   Smoking status   .  Former Smoker -- 2.0 packs/day for 40 years   .  Types:  Cigarettes   .  Quit date:  04/18/2011   Smokeless tobacco   .  Not on file    History   Alcohol Use   .  Yes     rare ETOH use    Allergies   Allergen  Reactions   .  Avelox (Moxifloxacin Hcl In Nacl)  Rash   .  Codeine  Rash    Current Outpatient Prescriptions   Medication  Sig  Dispense  Refill   .  aspirin 81 MG tablet  Take 81 mg by mouth 2 (two) times daily.     .  Aspirin-Acetaminophen-Caffeine (GOODY HEADACHE PO)  Take 1-2 packets by mouth 2 (two) times daily as needed. As needed for back pain & headaches.     [name]  BENICAR HCT 40-12.5 MG per tablet  Once daily     .  budesonide-formoterol (SYMBICORT) 160-4.5 MCG/ACT inhaler  Inhale 2 puffs into the lungs 2 (two) times daily.     .  CRESTOR 10 MG tablet  Take 10 mg by mouth daily. Once daily     .  Multiple Vitamin (MULITIVITAMIN WITH MINERALS) TABS  Take 1 tablet by mouth daily.      [name]  omeprazole (PRILOSEC) 20 MG capsule  Take 20 mg by mouth daily. Once daily     .  VENTOLIN HFA 108 (90 BASE) MCG/ACT inhaler  Inhale 2 puffs into the lungs every 6 (six) hours as needed. For shortness of breath     Review of Systems:  Cardiac Review of Systems: Y or N  Chest Pain [ y rt shoulder ] Resting SOB [ n ] Exertional SOB Cove.Etienne ] Orthopnea [ y ]  Pedal Edema [ n ] Palpitations [n ] Syncope [ n ] Presyncope [ n ]  General Review of Systems: [Y] = yes [ ] =no  Constitional: recent weight change [ y ]; anorexia [ n ]; fatigue Cove.Etienne ]; nausea [ n ]; night sweats [n ]; fever [ n ]; or chills [ n ];  Dental: poor dentition[ ] ; Last Dentist visit:  Eye : blurred vision [ ] ; diplopia [ ] ; vision changes [ ] ; Amaurosis fugax[ ] ;  Resp: cough [ y ]; wheezing[ y ]; hemoptysis[ n ]; shortness of breath[ y ]; paroxysmal nocturnal dyspnea[ n]; dyspnea on exertion[y ]; or orthopnea[n ];  GI: gallstones[ ] , vomiting[ ] ; dysphagia[ ] ; melena[ ] ; hematochezia [ n]; heartburn[ ] ; Hx of Colonoscopy[ ] ;  GU: kidney stones [ ] ; hematuria[ ] ; dysuria [ ] ; nocturia[ ] ; history of obstruction [ ] ;  Skin: rash, swelling[ ] ;, hair loss[ ] ; peripheral edema[ ] ; or itching[ ] ;  Musculosketetal: myalgias[ ] ; joint swelling[ ] ; joint erythema[ ] ;  joint pain[ y ]; back pain[y ];  Heme/Lymph: bruising[ ] ; bleeding[ ] ; anemia[ ] ;  Neuro: TIA[ ] ; headaches[ ] ; stroke[ ] ; vertigo[ ] ; seizures[ ] ; paresthesias[ ] ; difficulty walking[ ] ;  Psych:depression[ ] ; anxiety[ ] ;  Endocrine: diabetes[ ] ; thyroid dysfunction[ ] ;  Immunizations: Flu [ n ]; Pneumococcal[ n];  Other:  Physical Exam:  BP 123/82  Pulse 76  Resp 16  Ht 5\' 6"  (1.676 m)  Wt 132 lb (59.875 kg)  BMI 21.31 kg/m2  SpO2 97%  General appearance: alert, cooperative, appears older than stated age and no distress  Neurologic: intact  Heart: regular rate and rhythm, S1, S2 normal, no murmur, click, rub or gallop  Lungs: diminished breath sounds bibasilar    Abdomen: soft, non-tender; bowel sounds normal; no masses, no organomegaly  Extremities: extremities normal, atraumatic, no cyanosis or edema and Homans sign is negative, no sign of DVT  No cervical or supraclavicular adenopathy is appreciated  Diagnostic Studies & Laboratory data:  Recent Radiology Findings:  Rest Procedure: Myocardial perfusion imaging was performed at rest 45 minutes following the intravenous administration of Technetium 31m Tetrofosmin.  Rest ECG: SB  Stress Procedure: The patient exercised for 11:30 mins. The patient stopped due to leg fatigue/mild sob and denied any chest pain. There were no significant ST-T wave changes/occ PVCs/PACs. Technetium 42m Tetrofosmin was injected at peak exercise and myocardial perfusion imaging was performed after a brief delay.  Stress ECG: No significant change from baseline ECG  QPS  Raw Data Images: Patient motion noted; appropriate software correction applied.  Stress Images: Normal homogeneous uptake in all areas of the myocardium.  Rest Images: Normal homogeneous uptake in all areas of the myocardium.  Subtraction (SDS): No evidence of ischemia.  Transient Ischemic Dilatation (Normal <1.22): 1.09  Lung/Heart Ratio (Normal <0.45): 0.31  Quantitative Gated Spect Images  QGS EDV: 104 ml  QGS ESV: 43 ml  QGS cine images: Normal Wall Motion  QGS EF: 58%  Impression  Exercise Capacity: Good exercise capacity.  BP Response: Normal blood pressure response.  Clinical Symptoms: No chest pain.  ECG Impression: No significant ST segment change suggestive of ischemia.  Comparison with Prior Nuclear Study: No images to compare  Overall Impression: Normal stress nuclear study.  Willa Rough, MD  Mr Brain W Wo Contrast  04/24/2011 *RADIOLOGY REPORT* Clinical Data: Lung mass. Rule out metastatic disease. BUN and creatinine were obtained on site at [facility] at [address] Results: BUN 10 mg/dL, Creatinine 1.1 mg/dL. MRI HEAD  WITHOUT AND WITH CONTRAST Technique: Multiplanar, multiecho pulse sequences of the brain and surrounding structures were obtained according to standard protocol without and with intravenous contrast Contrast: 12mL MULTIHANCE GADOBENATE DIMEGLUMINE 529 MG/ML IV SOLN Comparison: CT head 07/01/2007 Findings: Ventricle size is normal. Negative for acute infarct. Scattered small nonenhancing hyperintensities in the subcortical white matter of the frontal lobes bilaterally, likely related to chronic microvascular ischemia. Brainstem and cerebellum are intact. Negative for hemorrhage or mass. No enhancing lesions are identified post contrast. Negative for metastatic disease. Mucosal thickening in the base of the maxillary antra bilaterally. IMPRESSION: Negative for metastatic disease. Mild chronic microvascular ischemic changes in the white matter. Original Report Authenticated By: Camelia Phenes, M.D.  Nm Pet Image Initial (pi) Skull Base To Thigh  04/10/2011 *RADIOLOGY REPORT* Clinical Data: Initial treatment strategy for abnormal chest CT, demonstrating a right upper lobe 2.5 cm nodule. NUCLEAR MEDICINE PET CT SKULL BASE TO THIGH Technique: 17.8 mCi F-18 FDG was injected intravenously via the right AC. Full-ring PET imaging was performed from the skull base through the mid-thighs 60 minutes after injection. CT data was obtained and used for attenuation correction and anatomic localization only. (This was not acquired as a diagnostic CT examination.) Fasting Blood Glucose: 94 Patient Weight: 128 pounds. Comparison: Chest CT of  04/01/2011 Findings: PET images demonstrate extensive muscular activity throughout the neck and upper chest. The right apical lung nodule primarily demonstrates low level, non malignant range hypermetabolism. For example, this measures a S.U.V. max of 1.8 on image 61. Along its cephalad aspect, where it contacts the pleural surface, an area of more moderate hypermetabolism measures a S.U.V. max of  2.8, including on image 58. No abnormal activity within the nodal stations of the mediastinum or hilum. No abnormal activity within the abdomen or pelvis. CT images performed for attenuation correction demonstrate right maxillary sinus mucous retention cyst or polyp. Advanced carotid atherosclerosis. Chest findings deferred to recent diagnostic chest CT. Coronary artery atherosclerosis, including likely within the left main coronary artery on image 99. No acute superimposed process identified. There is moderate to marked centrilobular emphysema. Lumbosacral spine fixation. IMPRESSION: 1. The right apical lung nodule demonstrates primarily low level nonmalignant range hypermetabolism. A portion of its peripheral/pleural-based component demonstrates borderline malignant range hypermetabolism. 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 Tissue sampling should be considered. 2. No evidence of hypermetabolic thoracic nodal or extrathoracic disease. Given the relative low level activity of the suspicious right apical nodule, PET may be of decreased sensitivity for nodal metastasis. 3. Coronary artery disease, including likely within the left main. Original Report Authenticated By: Consuello Bossier, M.D.  Nm Pet Image Initial (pi) Skull Base To Thigh  04/10/2011 *RADIOLOGY REPORT* Clinical Data: Initial treatment strategy for abnormal chest CT, demonstrating a right upper lobe 2.5 cm nodule. NUCLEAR MEDICINE PET CT SKULL BASE TO THIGH Technique: 17.8 mCi F-18 FDG was injected intravenously via the right AC. Full-ring PET imaging was performed from the skull base through the mid-thighs 60 minutes after injection. CT data was obtained and used for attenuation correction and anatomic localization only. (This was not acquired as a diagnostic CT examination.) Fasting Blood Glucose: 94 Patient Weight: 128 pounds. Comparison:  Chest CT of 04/01/2011 Findings: PET images demonstrate extensive muscular activity throughout the neck and upper chest. The right apical lung nodule primarily demonstrates low level, non malignant range hypermetabolism. For example, this measures a S.U.V. max of 1.8 on image 61. Along its cephalad aspect, where it contacts the pleural surface, an area of more moderate hypermetabolism measures a S.U.V. max of 2.8, including on image 58. No abnormal activity within the nodal stations of the mediastinum or hilum. No abnormal activity within the abdomen or pelvis. CT images performed for attenuation correction demonstrate right maxillary sinus mucous retention cyst or polyp. Advanced carotid atherosclerosis. Chest findings deferred to recent diagnostic chest CT. Coronary artery atherosclerosis, including likely within the left main coronary artery on image 99. No acute superimposed process identified. There is moderate to marked centrilobular emphysema. Lumbosacral spine fixation. IMPRESSION: 1. The right apical lung nodule demonstrates primarily low level nonmalignant range hypermetabolism. A portion of its peripheral/pleural-based component demonstrates borderline malignant range hypermetabolism. 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 Tissue sampling should be considered. 2. No evidence of hypermetabolic thoracic nodal or extrathoracic disease. Given the relative low level activity of the suspicious right apical nodule, PET may be of decreased sensitivity for nodal metastasis. 3. Coronary artery disease, including likely within the left main. Original Report Authenticated By: Consuello Bossier, M.D.  Recent Lab Findings:  Lab Results  Component Value Date   WBC 7.2  05/06/2011   HGB 13.7 05/06/2011   HCT 38.7* 05/06/2011   PLT 245 05/06/2011   GLUCOSE 107* 05/06/2011   ALT 22 05/06/2011   AST 28 05/06/2011   NA 139  05/06/2011   K 3.6 05/06/2011   CL 104 05/06/2011   CREATININE 0.83 05/06/2011   BUN 10 05/06/2011   CO2 24 05/06/2011   INR 0.94 05/06/2011   Dg Chest 2 View  05/06/2011  *RADIOLOGY REPORT*  Clinical Data: Preoperative evaluation.  History of right lung nodule.  She is smoking.  CHEST - 2 VIEW  Comparison: CT 04/01/2011  Findings: Nodular opacity measuring approximately 2.5 cm is seen within the apical portion of the right upper lobe without definite change from previous CT.  There is overall hyperinflation configuration consistent with COPD. Cardiac silhouette is normal size and shape. Mediastinal and hilar contours appear stable.  No pleural effusion is seen.  There is a generalized hyperinflation configuration.  No acute infiltrates are seen.  There are degenerative spondylosis changes compatible with age.  IMPRESSION: Right upper lobe nodular mass opacity is again evident without significant change.  Hyperinflation consistent with COPD.  No acute superimposed infiltrative density evident.  Original Report Authenticated By: Crawford Givens, M.D.      Full PFT done FEV1 1.89 60% FVC 3.33 80% Diffusion 90% predicted  Assessment / Plan:  Patient is a 61 year old smoker which radiographically presents with a new right upper lobe lung mass, that was not present on chest x-rays from 2009 in 2011 in addition a CT scan of the neck did not show any right upper lobe lung mass.  The mass is not significantly metabolically active on PET scan.  Because of the new onset of the mass in a long-term smoker I recommended to the patient that we proceed with surgical resection. Needle biopsy or EnG was discussed with the patient however was felt that since the lesion was new even a negative biopsy would not prevent proceeding with resection.  The risks and options of VATS and lung resection are discussed with the patient and his wife in detail.  The goals risks and alternatives of the planned surgical procedure Bronchscopy and rt  upper lung resection have been discussed with the patient in detail. The risks of the procedure including death, infection, stroke, myocardial infarction, bleeding, blood transfusion and prolonged air leak have all been discussed specifically. I have quoted Caleb [name] a 4% of perioperative mortality and a complication rate as high as 15 %. The patient's questions have been answered.Caleb [name] is willing to proceed with the planned procedure.   Delight Ovens MD  Beeper [phone]  Office [phone]  05/02/2011 12:15 PM

## 2011-05-08 NOTE — Anesthesia Procedure Notes (Signed)
Procedure Name: Intubation Date/Time: 05/08/2011 8:48 AM Performed by: Ellin Goodie Pre-anesthesia Checklist: Patient identified, Emergency Drugs available, Suction available, Patient being monitored and Timeout performed Patient Re-evaluated:Patient Re-evaluated prior to inductionOxygen Delivery Method: Circle System Utilized Preoxygenation: Pre-oxygenation with 100% oxygen Intubation Type: IV induction Ventilation: Mask ventilation without difficulty Laryngoscope Size: Mac and 3 Grade View: Grade I Tube type: Oral Endobronchial tube: Double lumen EBT and Left Number of attempts: 1 Airway Equipment and Method: stylet Placement Confirmation: ETT inserted through vocal cords under direct vision,  positive ETCO2 and breath sounds checked- equal and bilateral Tube secured with: Tape Dental Injury: Teeth and Oropharynx as per pre-operative assessment

## 2011-05-08 NOTE — OR Nursing (Signed)
Video Bronch procedure start at 08:48. Time out done prior to procedure. Vat/Resection procedure  Began at 09:26. Time out done prior to procedure.

## 2011-05-08 NOTE — Preoperative (Signed)
Beta Blockers   Reason not to administer Beta Blockers:Not Applicable 

## 2011-05-08 NOTE — Progress Notes (Signed)
Pt. Tol. P.o.'s well.  Waiting on 2300 bed. Resting quietly. Pain is well controlled using PCA

## 2011-05-08 NOTE — Progress Notes (Signed)
Post op VATS Breathing comfortably w/ good Sats

## 2011-05-09 ENCOUNTER — Inpatient Hospital Stay (HOSPITAL_COMMUNITY): Payer: 59

## 2011-05-09 DIAGNOSIS — J449 Chronic obstructive pulmonary disease, unspecified: Secondary | ICD-10-CM

## 2011-05-09 DIAGNOSIS — R222 Localized swelling, mass and lump, trunk: Secondary | ICD-10-CM

## 2011-05-09 DIAGNOSIS — F17200 Nicotine dependence, unspecified, uncomplicated: Secondary | ICD-10-CM

## 2011-05-09 LAB — BASIC METABOLIC PANEL
BUN: 8 mg/dL (ref 6–23)
Creatinine, Ser: 0.76 mg/dL (ref 0.50–1.35)
GFR calc Af Amer: >90 mL/min (ref >90)
GFR calc non Af Amer: >90 mL/min (ref >90)
Potassium: 4 mEq/L (ref 3.5–5.1)

## 2011-05-09 LAB — POCT I-STAT 3, ART BLOOD GAS (G3+)
Bicarbonate: 25.2 mEq/L — ABNORMAL HIGH (ref 20.0–24.0)
O2 Saturation: 97 %
Patient temperature: 97.1
TCO2: 26 mmol/L (ref 0–100)
pCO2 arterial: 39.2 mmHg (ref 35.0–45.0)
pH, Arterial: 7.413 (ref 7.350–7.450)
pO2, Arterial: 87 mmHg (ref 80.0–100.0)

## 2011-05-09 LAB — CBC
HCT: 34.5 % — ABNORMAL LOW (ref 39.0–52.0)
MCHC: 34.2 g/dL (ref 30.0–36.0)
Platelets: 234 10*3/uL (ref 150–400)
RDW: 13.5 % (ref 11.5–15.5)

## 2011-05-09 LAB — HEPATITIS B SURFACE ANTIGEN: Hepatitis B Surface Ag: NEGATIVE

## 2011-05-09 LAB — HEPATITIS B SURFACE ANTIBODY,QUALITATIVE: Hep B S Ab: NEGATIVE

## 2011-05-09 MED ORDER — ETHAMBUTOL HCL 400 MG PO TABS: 15.0000 mg/kg | ORAL_TABLET | Freq: Every day | ORAL | Status: DC

## 2011-05-09 MED ORDER — ETHAMBUTOL HCL 400 MG PO TABS
900.0000 mg | ORAL_TABLET | Freq: Every day | ORAL | Status: DC
  Administered 2011-05-09 – 2011-05-10 (×2): 900 mg via ORAL
  Filled 2011-05-09 (×2): qty 1

## 2011-05-09 MED ORDER — VITAMIN B-6 50 MG PO TABS
50.0000 mg | ORAL_TABLET | Freq: Every day | ORAL | Status: DC
  Administered 2011-05-09 – 2011-05-14 (×6): 50 mg via ORAL
  Filled 2011-05-09 (×6): qty 1

## 2011-05-09 MED ORDER — ISONIAZID 300 MG PO TABS
300.0000 mg | ORAL_TABLET | Freq: Every day | ORAL | Status: DC
  Administered 2011-05-09 – 2011-05-14 (×6): 300 mg via ORAL
  Filled 2011-05-09 (×7): qty 1

## 2011-05-09 MED ORDER — RIFAMPIN 300 MG PO CAPS
300.0000 mg | ORAL_CAPSULE | Freq: Two times a day (BID) | ORAL | Status: DC
  Administered 2011-05-09 – 2011-05-13 (×8): 300 mg via ORAL
  Filled 2011-05-09 (×9): qty 1

## 2011-05-09 MED ORDER — ALPRAZOLAM 0.25 MG PO TABS
0.2500 mg | ORAL_TABLET | Freq: Three times a day (TID) | ORAL | Status: DC | PRN
  Administered 2011-05-09 – 2011-05-14 (×6): 0.25 mg via ORAL
  Filled 2011-05-09 (×6): qty 1

## 2011-05-09 MED ORDER — ENOXAPARIN SODIUM 30 MG/0.3ML ~~LOC~~ SOLN
30.0000 mg | SUBCUTANEOUS | Status: DC
  Administered 2011-05-09 – 2011-05-10 (×2): 30 mg via SUBCUTANEOUS
  Filled 2011-05-09 (×3): qty 0.3

## 2011-05-09 MED ORDER — PYRAZINAMIDE 500 MG PO TABS
1000.0000 mg | ORAL_TABLET | Freq: Every day | ORAL | Status: DC
  Administered 2011-05-09 – 2011-05-10 (×2): 1000 mg via ORAL
  Filled 2011-05-09 (×2): qty 2

## 2011-05-09 NOTE — Progress Notes (Signed)
Patient ID: Caleb [name], male   DOB: Apr 04, 1950, 61 y.o.   MRN: [id number] TCTS DAILY PROGRESS NOTE                   [address]            [place] [address]          [phone]      1 Day Post-Op Procedure(s) (LRB): VIDEO BRONCHOSCOPY (N/A) VIDEO ASSISTED THORACOSCOPY (VATS)/WEDGE RESECTION (Right)  Total Length of Stay:  LOS: 1 day   Subjective: Up walking in hall, no sob  Objective: Vital signs in last 24 hours: Temp:  [97.2 F (36.2 C)-98.8 F (37.1 C)] 97.7 F (36.5 C) (02/07 0300) Pulse Rate:  [53-72] 72  (02/07 0700) Cardiac Rhythm:  [-] Normal sinus rhythm (02/07 0714) Resp:  [9-21] 21  (02/07 0700) BP: (89-157)/(57-89) 89/71 mmHg (02/07 0600) SpO2:  [94 %-100 %] 94 % (02/07 0700) Arterial Line BP: (115-178)/(54-112) 117/112 mmHg (02/07 0700) Weight:  [131 lb (59.421 kg)-131 lb 9.8 oz (59.7 kg)] 131 lb 9.8 oz (59.7 kg) (02/07 0636)  Filed Weights   05/08/11 1723 05/09/11 0636  Weight: 131 lb (59.421 kg) 131 lb 9.8 oz (59.7 kg)    Weight change:        Intake/Output from previous day: 02/06 0701 - 02/07 0700 In: 4800.7 [P.O.:400; I.V.:4050.7; IV Piggyback:350] Out: 4780 [Urine:4500; Chest Tube:280]  Intake/Output this shift: Total I/O In: -  Out: 250 [Urine:250]  Current Meds: Scheduled Meds:   . acetaminophen  1,000 mg Intravenous Q6H  . aspirin  81 mg Oral BID  . bisacodyl  10 mg Oral Daily  . budesonide-formoterol  2 puff Inhalation BID  . bupivacaine 0.5 % ON-Q pump SINGLE CATH 400 mL  400 mL Other To OR  . cefUROXime (ZINACEF)  IV  1.5 g Intravenous 60 min Pre-Op  . cefUROXime (ZINACEF)  IV  1.5 g Intravenous Q12H  . fentaNYL   Intravenous Q4H  . fentaNYL   Intravenous To PACU  . meperidine      . mulitivitamin with minerals  1 tablet Oral Daily  . nicotine  14 mg Transdermal Daily  . rosuvastatin  10 mg Oral Daily  . DISCONTD: aspirin  81 mg Oral BID   Continuous Infusions:   . 0.9 % NaCl with KCl 20 mEq / L  125 mL/hr at 05/09/11 0331  . bupivacaine ON-Q pain pump    . DISCONTD: dexmedetomidine (PRECEDEX) IV infusion    . DISCONTD: lactated ringers     PRN Meds:.albuterol, diphenhydrAMINE, diphenhydrAMINE, naloxone, ondansetron (ZOFRAN) IV, ondansetron (ZOFRAN) IV, potassium chloride, senna-docusate, sodium chloride, traMADol, DISCONTD: 0.9 % irrigation (POUR BTL), DISCONTD: bupivacaine, DISCONTD: fentaNYL, DISCONTD: fentaNYL, DISCONTD: meperidine, DISCONTD: meperidine, DISCONTD: promethazine, DISCONTD: promethazine  General appearance: alert, cooperative and no distress Neurologic: intact Heart: regular rate and rhythm, S1, S2 normal, no murmur, click, rub or gallop Lungs: clear to auscultation bilaterally Abdomen: soft, non-tender; bowel sounds normal; no masses,  no organomegaly Extremities: extremities normal, atraumatic, no cyanosis or edema and Homans sign is negative, no sign of DVT small air leak  Lab Results: CBC: Basename 05/09/11 0431 05/06/11 1538  WBC 10.3 7.2  HGB 11.8* 13.7  HCT 34.5* 38.7*  PLT 234 245   BMET:  Basename 05/09/11 0431 05/06/11 1538  NA 137 139  K 4.0 3.6  CL 105 104  CO2 26 24  GLUCOSE 93 107*  BUN 8 10  CREATININE 0.76 0.83  CALCIUM 8.8 9.4    PT/INR:  Basename 05/06/11 1538  LABPROT 12.8  INR 0.94   Radiology: Dg Chest Port 1 View  05/09/2011  *RADIOLOGY REPORT*  Clinical Data: Chest tubes, follow-up  PORTABLE CHEST - 1 VIEW  Comparison: Portable chest x-ray of 05/08/2011  Findings: The right apical pneumothorax is unchanged with sutures in the right upper lung field and postoperative opacity remaining. Right chest tube is present.  Right IJ central venous line tip remains in the lower SVC and right chest wall subcutaneous air has increased slightly.  The left lung is clear with only mild left basilar atelectasis.  The heart is within normal limits in size.  IMPRESSION:  1.  No significant change in right apical pneumothorax with right chest tube  remaining. 2.  Slight increase in basilar atelectasis right greater than left.  Original Report Authenticated By: Juline Patch, M.D.   Dg Chest Portable 1 View  05/08/2011  *RADIOLOGY REPORT*  Clinical Data: Status post VATS on the right.  PORTABLE CHEST - 1 VIEW  Comparison: Plain film chest 05/06/2011.  Findings: The patient is status post resection of a right upper lobe lesion.  Right IJ catheter is in place with the tip in the lower superior vena cava and a right chest tube identified.  The patient has a right apical and likely right basilar pneumothorax estimated at 20% or less overall.  Left lung is clear.  Heart size is normal.  IMPRESSION: Postoperative change on the right as described with no pneumothorax estimated at 20% or less.  Original Report Authenticated By: Bernadene Bell. Maricela Curet, M.D.     Assessment/Plan: S/P Procedure(s) (LRB): VIDEO BRONCHOSCOPY (N/A) VIDEO ASSISTED THORACOSCOPY (VATS)/WEDGE RESECTION (Right) With small air leak and apical space CT to water seal Decrease IV fluid Ambulate To 3300   Delight Ovens MD  Beeper [phone] Office [phone] 05/09/2011 8:29 AM

## 2011-05-09 NOTE — Progress Notes (Signed)
TCTS BRIEF SICU PROGRESS NOTE  1 Day Post-Op  S/P Procedure(s) (LRB): VIDEO BRONCHOSCOPY (N/A) VIDEO ASSISTED THORACOSCOPY (VATS)/WEDGE RESECTION (Right)   Stable day Good pain control with PCA Breathing comfortably  Plan: Awaiting room for transfer to step down  Kasmira Cacioppo H 05/09/2011 9:07 PM

## 2011-05-09 NOTE — Progress Notes (Signed)
Nursing Daily Progress:  Throughout the day this patient has been stable.  This afternoon positive AFB labs indicated that we move him to a negative pressure room and begin airborne precautions as per infection control.  Both family and patient were extensively educated on his condition, tests and contact precautions both by me at the bedside and the infectious disease physician.  Patient is very anxious concerning his condition and request minimal visitation. Will continue to closely monitor.

## 2011-05-09 NOTE — Progress Notes (Signed)
UR Completed.  Jaquan Sadowsky Jane [phone] 05/09/2011  
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
## 2011-05-10 ENCOUNTER — Inpatient Hospital Stay (HOSPITAL_COMMUNITY): Payer: 59

## 2011-05-10 ENCOUNTER — Encounter (HOSPITAL_COMMUNITY): Payer: Self-pay | Admitting: Cardiothoracic Surgery

## 2011-05-10 LAB — CBC
HCT: 33.2 % — ABNORMAL LOW (ref 39.0–52.0)
Hemoglobin: 11.3 g/dL — ABNORMAL LOW (ref 13.0–17.0)
MCH: 31.9 pg (ref 26.0–34.0)
MCHC: 34 g/dL (ref 30.0–36.0)
MCV: 93.8 fL (ref 78.0–100.0)
Platelets: 234 10*3/uL (ref 150–400)
RBC: 3.54 MIL/uL — ABNORMAL LOW (ref 4.22–5.81)
RDW: 13.6 % (ref 11.5–15.5)
WBC: 9.2 10*3/uL (ref 4.0–10.5)

## 2011-05-10 LAB — COMPREHENSIVE METABOLIC PANEL
ALT: 18 U/L (ref 0–53)
AST: 33 U/L (ref 0–37)
Albumin: 3.1 g/dL — ABNORMAL LOW (ref 3.5–5.2)
Alkaline Phosphatase: 51 U/L (ref 39–117)
BUN: 10 mg/dL (ref 6–23)
CO2: 28 mEq/L (ref 19–32)
Calcium: 9 mg/dL (ref 8.4–10.5)
Chloride: 104 mEq/L (ref 96–112)
Creatinine, Ser: 0.81 mg/dL (ref 0.50–1.35)
GFR calc Af Amer: >90 mL/min (ref >90)
GFR calc non Af Amer: >90 mL/min (ref >90)
Glucose, Bld: 108 mg/dL — ABNORMAL HIGH (ref 70–99)
Potassium: 3.7 mEq/L (ref 3.5–5.1)
Sodium: 139 mEq/L (ref 135–145)
Total Bilirubin: 0.4 mg/dL (ref 0.3–1.2)
Total Protein: 5.7 g/dL — ABNORMAL LOW (ref 6.0–8.3)

## 2011-05-10 MED ORDER — PYRAZINAMIDE 500 MG PO TABS
500.0000 mg | ORAL_TABLET | Freq: Once | ORAL | Status: AC
  Administered 2011-05-10: 500 mg via ORAL
  Filled 2011-05-10: qty 1

## 2011-05-10 MED ORDER — ETHAMBUTOL HCL 400 MG PO TABS
1200.0000 mg | ORAL_TABLET | Freq: Every day | ORAL | Status: DC
  Administered 2011-05-11 – 2011-05-14 (×4): 1200 mg via ORAL
  Filled 2011-05-10 (×4): qty 3

## 2011-05-10 MED ORDER — PYRAZINAMIDE 500 MG PO TABS
1500.0000 mg | ORAL_TABLET | Freq: Every day | ORAL | Status: DC
  Administered 2011-05-11 – 2011-05-14 (×4): 1500 mg via ORAL
  Filled 2011-05-10 (×6): qty 3

## 2011-05-10 MED ORDER — ENOXAPARIN SODIUM 40 MG/0.4ML ~~LOC~~ SOLN
40.0000 mg | SUBCUTANEOUS | Status: DC
  Administered 2011-05-11 – 2011-05-14 (×4): 40 mg via SUBCUTANEOUS
  Filled 2011-05-10 (×5): qty 0.4

## 2011-05-10 NOTE — Progress Notes (Signed)
Patient ID: Caleb [name], male   DOB: 19-Mar-1951, 61 y.o.   MRN: [id number] TCTS DAILY PROGRESS NOTE                   [address]            [place] [address]          [phone]      2 Days Post-Op Procedure(s) (LRB): VIDEO BRONCHOSCOPY (N/A) VIDEO ASSISTED THORACOSCOPY (VATS)/WEDGE RESECTION (Right)  Total Length of Stay:  LOS: 2 days   Subjective: Good respiratory effort  No air leak  Objective: Vital signs in last 24 hours: Temp:  [98 F (36.7 C)-99.3 F (37.4 C)] 99.3 F (37.4 C) (02/08 1315) Pulse Rate:  [75-93] 93  (02/08 1415) Cardiac Rhythm:  [-] Normal sinus rhythm (02/08 1315) Resp:  [14-24] 20  (02/08 1530) BP: (113-166)/(60-97) 156/91 mmHg (02/08 1415) SpO2:  [93 %-97 %] 97 % (02/08 1530)  Filed Weights   05/08/11 1723 05/09/11 0636  Weight: 131 lb (59.421 kg) 131 lb 9.8 oz (59.7 kg)    Weight change:    Hemodynamic parameters for last 24 hours:    Intake/Output from previous day: 02/07 0701 - 02/08 0700 In: 3604 [P.O.:3070; I.V.:534] Out: 3200 [Urine:3100; Chest Tube:100]  Intake/Output this shift: Total I/O In: 504 [P.O.:300; I.V.:204] Out: 1300 [Urine:1300]  Current Meds: Scheduled Meds:   . aspirin  81 mg Oral BID  . bisacodyl  10 mg Oral Daily  . budesonide-formoterol  2 puff Inhalation BID  . enoxaparin  40 mg Subcutaneous Q24H  . ethambutol  1,200 mg Oral Daily  . fentaNYL   Intravenous Q4H  . isoniazid  300 mg Oral Daily  . mulitivitamin with minerals  1 tablet Oral Daily  . nicotine  14 mg Transdermal Daily  . pyrazinamide  1,500 mg Oral Daily  . pyrazinamide  500 mg Oral Once  . pyridOXINE  50 mg Oral Daily  . rifampin  300 mg Oral Q12H  . rosuvastatin  10 mg Oral Daily  . DISCONTD: enoxaparin  30 mg Subcutaneous Q24H  . DISCONTD: ethambutol  900 mg Oral Daily  . DISCONTD: pyrazinamide  1,000 mg Oral Daily   Continuous Infusions:   . 0.9 % NaCl with KCl 20 mEq / L 20 mL/hr at 05/10/11 1600  .  bupivacaine ON-Q pain pump     PRN Meds:.albuterol, ALPRAZolam, diphenhydrAMINE, diphenhydrAMINE, naloxone, ondansetron (ZOFRAN) IV, ondansetron (ZOFRAN) IV, potassium chloride, senna-docusate, sodium chloride, traMADol  General appearance: alert and cooperative Neurologic: intact Heart: regular rate and rhythm, S1, S2 normal, no murmur, click, rub or gallop Lungs: clear to auscultation bilaterally Abdomen: soft, non-tender; bowel sounds normal; no masses,  no organomegaly Extremities: extremities normal, atraumatic, no cyanosis or edema and Homans sign is negative, no sign of DVT No air leak now   Lab Results: CBC: Basename 05/10/11 0428 05/09/11 0431  WBC 9.2 10.3  HGB 11.3* 11.8*  HCT 33.2* 34.5*  PLT 234 234   BMET:  Basename 05/10/11 0428 05/09/11 0431  NA 139 137  K 3.7 4.0  CL 104 105  CO2 28 26  GLUCOSE 108* 93  BUN 10 8  CREATININE 0.81 0.76  CALCIUM 9.0 8.8    PT/INR: No results found for this basename: LABPROT,INR in the last 72 hours Radiology: Dg Chest Port 1 View  05/10/2011  *RADIOLOGY REPORT*  Clinical Data: Postop (VATS)  PORTABLE CHEST - 1 VIEW  Comparison: 05/09/2011; 05/08/2011; 05/06/2011  Findings:  Grossly unchanged cardiac silhouette and mediastinal contours. Stable positioning of support apparatus. No change to minimal decrease in small right apical pneumothorax.  The amount of right lateral chest wall and supraclavicular fossa subcutaneous emphysema has minimally increased in the interval.  There is persistent mild elevation of the right hemidiaphragm with grossly unchanged right basilar heterogeneous opacities.  Minimal increase in heterogeneous opacities within the right mid lung.  The left hemithorax is unchanged.  Grossly unchanged bones.  IMPRESSION: 1.  Stable position of support apparatus.  No change to minimal decrease in right-sided small pneumothorax. 2.  Minimal increase in right mid lung heterogeneous opacities, possibly atelectasis. 3.  Minimal  increase in amount of right lateral chest wall subcutaneous emphysema.  Original Report Authenticated By: Waynard Reeds, M.D.   Dg Chest Port 1 View  05/09/2011  *RADIOLOGY REPORT*  Clinical Data: Chest tubes, follow-up  PORTABLE CHEST - 1 VIEW  Comparison: Portable chest x-ray of 05/08/2011  Findings: The right apical pneumothorax is unchanged with sutures in the right upper lung field and postoperative opacity remaining. Right chest tube is present.  Right IJ central venous line tip remains in the lower SVC and right chest wall subcutaneous air has increased slightly.  The left lung is clear with only mild left basilar atelectasis.  The heart is within normal limits in size.  IMPRESSION:  1.  No significant change in right apical pneumothorax with right chest tube remaining. 2.  Slight increase in basilar atelectasis right greater than left.  Original Report Authenticated By: Juline Patch, M.D.     Assessment/Plan: S/P Procedure(s) (LRB): VIDEO BRONCHOSCOPY (N/A) VIDEO ASSISTED THORACOSCOPY (VATS)/WEDGE RESECTION (Right) Small apical space no air leak CT to water seal today     Delight Ovens MD  Beeper [phone] Office [phone] 05/10/2011 5:31 PM

## 2011-05-10 NOTE — Progress Notes (Signed)
INFECTIOUS DISEASE PROGRESS NOTE  ID: Caleb [name] is a 61 y.o. male with   Active Problems:  Chest mass  COPD (chronic obstructive pulmonary disease)  Tobacco abuse  Subjective: Continued pain at CT site  Abtx:  Anti-infectives     Start     Dose/Rate Route Frequency Ordered Stop   05/11/11 1000   ethambutol (MYAMBUTOL) tablet 1,200 mg        1,200 mg Oral Daily 05/10/11 1006     05/10/11 1015   pyrazinamide tablet 1,500 mg        1,500 mg Oral Daily 05/10/11 1006     05/09/11 2200   rifampin (RIFADIN) capsule 300 mg        300 mg Oral Every 12 hours 05/09/11 1606     05/09/11 1700   isoniazid (NYDRAZID) tablet 300 mg        300 mg Oral Daily 05/09/11 1606     05/09/11 1700   pyrazinamide tablet 1,000 mg  Status:  Discontinued        1,000 mg Oral Daily 05/09/11 1606 05/10/11 1006   05/09/11 1700   ethambutol (MYAMBUTOL) tablet 900 mg  Status:  Discontinued        900 mg Oral Daily 05/09/11 1622 05/10/11 1006   05/09/11 1615   ethambutol (MYAMBUTOL) tablet 900 mg  Status:  Discontinued        15 mg/kg  59.7 kg Oral Daily 05/09/11 1606 05/09/11 1621   05/08/11 2100   cefUROXime (ZINACEF) 1.5 g in dextrose 5 % 50 mL IVPB        1.5 g 100 mL/hr over 30 Minutes Intravenous Every 12 hours 05/08/11 1800 05/09/11 0922   05/07/11 1430   cefUROXime (ZINACEF) 1.5 g in dextrose 5 % 50 mL IVPB        1.5 g 100 mL/hr over 30 Minutes Intravenous 60 min pre-op 05/07/11 1427 05/08/11 0848          Medications:  Scheduled:   . acetaminophen  1,000 mg Intravenous Q6H  . aspirin  81 mg Oral BID  . bisacodyl  10 mg Oral Daily  . budesonide-formoterol  2 puff Inhalation BID  . enoxaparin  30 mg Subcutaneous Q24H  . ethambutol  1,200 mg Oral Daily  . fentaNYL   Intravenous Q4H  . isoniazid  300 mg Oral Daily  . mulitivitamin with minerals  1 tablet Oral Daily  . nicotine  14 mg Transdermal Daily  . pyrazinamide  1,500 mg Oral Daily  . pyridOXINE  50 mg Oral Daily  .  rifampin  300 mg Oral Q12H  . rosuvastatin  10 mg Oral Daily  . DISCONTD: ethambutol  15 mg/kg Oral Daily  . DISCONTD: ethambutol  900 mg Oral Daily  . DISCONTD: pyrazinamide  1,000 mg Oral Daily    Objective: Vital signs in last 24 hours: Temp:  [97.6 F (36.4 C)-98.9 F (37.2 C)] 98.9 F (37.2 C) (02/08 0732) Pulse Rate:  [75-94] 82  (02/08 0700) Resp:  [14-24] 14  (02/08 0800) BP: (113-166)/(69-87) 135/79 mmHg (02/08 0700) SpO2:  [93 %-100 %] 94 % (02/08 0800) Arterial Line BP: (161-189)/(77-86) 189/86 mmHg (02/07 1200)   General appearance: alert and no distress Neck: R neck IJ Resp: diminished breath sounds anterior - bilateral Cardio: regular rate and rhythm GI: normal findings: bowel sounds normal and soft, non-tender  Lab Results  Basename 05/10/11 0428 05/09/11 0431  WBC 9.2 10.3  HGB 11.3* 11.8*  HCT  33.2* 34.5*  NA 139 137  K 3.7 4.0  CL 104 105  CO2 28 26  BUN 10 8  CREATININE 0.81 0.76  GLU -- --   Liver Panel  Basename 05/10/11 0428  PROT 5.7*  ALBUMIN 3.1*  AST 33  ALT 18  ALKPHOS 51  BILITOT 0.4  BILIDIR --  IBILI --   Sedimentation Rate No results found for this basename: ESRSEDRATE in the last 72 hours C-Reactive Protein No results found for this basename: CRP:2 in the last 72 hours  Microbiology: Recent Results (from the past 240 hour(s))  SURGICAL PCR SCREEN     Status: Normal   Collection Time   05/06/11  3:25 PM      Component Value Range Status Comment   MRSA, PCR NEGATIVE  NEGATIVE  Final    Staphylococcus aureus NEGATIVE  NEGATIVE  Final   AFB CULTURE WITH SMEAR     Status: Normal (Preliminary result)   Collection Time   05/08/11 10:00 AM      Component Value Range Status Comment   Specimen Description TISSUE LUNG RIGHT UPPER   Final    Special Requests NONE   Final    ACID FAST SMEAR     Final    Value: 2+ ACID FAST BACILLI SEEN CRITICAL RESULT CALLED TO, READ BACK BY AND VERIFIED WITH: JOSH DRAPER @ 0229 ON 05/09/11 BY  WILSN CRITICAL RESULT CALLED TO, READ BACK BY AND VERIFIED WITH: MARLAEN DAWALT @ [facility] 05/09/11   Culture     Final    Value: CULTURE WILL BE EXAMINED FOR 6 WEEKS BEFORE ISSUING A FINAL REPORT   Report Status PENDING   Incomplete   TISSUE CULTURE     Status: Normal (Preliminary result)   Collection Time   05/08/11 10:00 AM      Component Value Range Status Comment   Specimen Description TISSUE LUNG RIGHT UPPER   Final    Special Requests NONE   Final    Gram Stain     Final    Value: NO WBC SEEN     NO SQUAMOUS EPITHELIAL CELLS SEEN     NO ORGANISMS SEEN   Culture NO GROWTH 2 DAYS   Final    Report Status PENDING   Incomplete   FUNGUS CULTURE W SMEAR     Status: Normal (Preliminary result)   Collection Time   05/08/11 10:00 AM      Component Value Range Status Comment   Specimen Description TISSUE LUNG RIGHT UPPER   Final    Special Requests NONE   Final    Fungal Smear NO YEAST OR FUNGAL ELEMENTS SEEN   Final    Culture CULTURE IN PROGRESS FOR FOUR WEEKS   Final    Report Status PENDING   Incomplete     Studies/Results: Dg Chest Port 1 View  05/10/2011  *RADIOLOGY REPORT*  Clinical Data: Postop (VATS)  PORTABLE CHEST - 1 VIEW  Comparison: 05/09/2011; 05/08/2011; 05/06/2011  Findings:  Grossly unchanged cardiac silhouette and mediastinal contours. Stable positioning of support apparatus. No change to minimal decrease in small right apical pneumothorax.  The amount of right lateral chest wall and supraclavicular fossa subcutaneous emphysema has minimally increased in the interval.  There is persistent mild elevation of the right hemidiaphragm with grossly unchanged right basilar heterogeneous opacities.  Minimal increase in heterogeneous opacities within the right mid lung.  The left hemithorax is unchanged.  Grossly unchanged bones.  IMPRESSION: 1.  Stable  position of support apparatus.  No change to minimal decrease in right-sided small pneumothorax. 2.  Minimal increase in right mid  lung heterogeneous opacities, possibly atelectasis. 3.  Minimal increase in amount of right lateral chest wall subcutaneous emphysema.  Original Report Authenticated By: Waynard Reeds, M.D.   Dg Chest Port 1 View  05/09/2011  *RADIOLOGY REPORT*  Clinical Data: Chest tubes, follow-up  PORTABLE CHEST - 1 VIEW  Comparison: Portable chest x-ray of 05/08/2011  Findings: The right apical pneumothorax is unchanged with sutures in the right upper lung field and postoperative opacity remaining. Right chest tube is present.  Right IJ central venous line tip remains in the lower SVC and right chest wall subcutaneous air has increased slightly.  The left lung is clear with only mild left basilar atelectasis.  The heart is within normal limits in size.  IMPRESSION:  1.  No significant change in right apical pneumothorax with right chest tube remaining. 2.  Slight increase in basilar atelectasis right greater than left.  Original Report Authenticated By: Juline Patch, M.D.   Dg Chest Portable 1 View  05/08/2011  *RADIOLOGY REPORT*  Clinical Data: Status post VATS on the right.  PORTABLE CHEST - 1 VIEW  Comparison: Plain film chest 05/06/2011.  Findings: The patient is status post resection of a right upper lobe lesion.  Right IJ catheter is in place with the tip in the lower superior vena cava and a right chest tube identified.  The patient has a right apical and likely right basilar pneumothorax estimated at 20% or less overall.  Left lung is clear.  Heart size is normal.  IMPRESSION: Postoperative change on the right as described with no pneumothorax estimated at 20% or less.  Original Report Authenticated By: Bernadene Bell. Maricela Curet, M.D.     Assessment/Plan: TB- suspected I have spoken with infection prevention at [facility]- they are beginning contact tracing.  I have spoken with the health dept- they will begin contact tracing.  Medications adjusted according to their protocol.  I have ordered sptum AFb (pt not  currently coughing).  I have ordered quantiferon gold (per health dept protocol).  Pt concerned about his potential family exposures. I have spoken with path and asked them to perform TB PCR on tissue.   Johny Sax Infectious Diseases [phone] 05/10/2011, 10:07 AM

## 2011-05-10 NOTE — Significant Event (Signed)
Pt transferred to 3300 to a negative pressure room. VS stable prior and during the transfer. Pt is settled in chair. Primary RN in room. All belongings at bedside at 3300. Family is aware of transfer. Sintia Mckissic, Charity fundraiser

## 2011-05-10 NOTE — Op Note (Signed)
NAMEMarland Kitchen  Caleb [name], Caleb [name] NO.:  [id number]  MEDICAL RECORD NO.:  [id number]  LOCATION:  2301                         FACILITY:  [facility]  PHYSICIAN:  Sheliah Plane, MD    DATE OF BIRTH:  [birth year]  DATE OF PROCEDURE:  05/08/2011 DATE OF DISCHARGE:                              OPERATIVE REPORT   PREOPERATIVE DIAGNOSIS:  New right upper lobe lung lesion, suspicious for carcinoma of the lung.  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  PROCEDURE PERFORMED:  Bronchoscopy, right video-assisted thoracoscopy, mini thoracotomy, wedge resection of right upper lobe lung lesion, placement of On-Q.  SURGEON:  Sheliah Plane, MD  FIRST ASSISTANT:  Doree Fudge, PA  BRIEF HISTORY:  The patient is a 61 year old male who has known underlying pulmonary disease and has been a heavy smoker for many years. The month prior to surgery, he had developed some cough and chest discomfort, saw his medical physician and asked for chest x-ray.  This revealed a right upper lobe lung lesion approximately 2 cm in size, suspicious for carcinoma of the lung.  CT scan was performed and compared to a CT scan of the neck 18 months before.  The comparable images on the 2 scans showed the lesion to be new and not present on 18 months prior.  PET scan showed only borderline activity.  Treatment in obtaining a tissue diagnosis was recommended to the patient.  There was no evidence of mediastinal adenopathy either on CT scan or PET scan. The risks and options were discussed with the patient in detail.  He agreed and signed informed consent.  DESCRIPTION OF PROCEDURE:  With central line, arterial line placed, the patient underwent general endotracheal anesthesia without incident. Double-lumen endotracheal tube was placed without difficulty.  Through this, fiberoptic bronchoscope was passed and revealed no  endobronchial lesion and good position of the double-lumen endotracheal tube.  The scope was removed.  The patient was turned in the lateral decubitus position with the right side up.  Time out and confirmation of side was completed.  The right chest was prepped with Betadine and draped in usual sterile manner.  An anterior and posterior port site was created. Through this, a 30 degree video scope was passed.  There was good deflation of the lung.  There was a single adhesion from the apex of the lung that was taken down.  This gave good mobility though on the surface of the lung at the apex, the lesion was not appreciated.  A small incision was made at approximately the fourth intercostal space, anterior axillary line.  Through this, the lung was able to be palpated and a 2 cm mass was located at the apex of the lung.  Through the port sites, a wedge resection with stapler was performed and the specimen was submitted in total to the pathologist to check margins and also frozen on the vein specimen.  The specimen showed no evidence of malignancy and was admitted both for AFB, fungal, and microscopic examination by microbiology.  No further resection of the lung was performed.  A 28 chest tube was left through 1  port site.  The incision was closed with a single pericostal suture.  The muscle layers were reapproximated with #2 Vicryl sutures.  The muscle layers had been separated rather than cut. The remaining port site was closed with interrupted 0 Vicryl in the deep layer and a 3-0 subcuticular stitch.  The large incision was closed with a running 2-0 Vicryl in the subcutaneous tissue and a 3-0 subcuticular stitch in skin edges.  Dry dressings were applied.  Sponge and needle count was reported as correct at completion of procedure.  The lung was reinflated without evidence of air leak.  The patient was awakened and extubated in the operating room and transferred to recovery room  for further postoperative care.  Blood loss was minimal.     Sheliah Plane, MD     EG/MEDQ  D:  05/10/2011  T:  05/10/2011  Job:  [id number]

## 2011-05-10 NOTE — Plan of Care (Signed)
Problem: Phase III Progression Outcomes Goal: Transferred to Telemetry Outcome: Completed/Met Date Met:  05/10/11 Transferred to 3300, room 2, negative pressure room at 1300pm

## 2011-05-11 ENCOUNTER — Inpatient Hospital Stay (HOSPITAL_COMMUNITY): Payer: 59

## 2011-05-11 LAB — TISSUE CULTURE
Culture: NO GROWTH
Gram Stain: NONE SEEN

## 2011-05-11 MED ORDER — ACETAMINOPHEN 325 MG PO TABS
650.0000 mg | ORAL_TABLET | Freq: Four times a day (QID) | ORAL | Status: DC | PRN
  Administered 2011-05-11 – 2011-05-12 (×3): 650 mg via ORAL
  Filled 2011-05-11 (×3): qty 2

## 2011-05-11 NOTE — Progress Notes (Addendum)
[address]            [place] [address]          [phone]     3 Days Post-Op  Procedure(s) (LRB): VIDEO BRONCHOSCOPY (N/A) VIDEO ASSISTED THORACOSCOPY (VATS)/WEDGE RESECTION (Right) Subjective: Sore but feels ok  Objective  Telemetry NSR  Temp:  [98.1 F (36.7 C)-99.6 F (37.6 C)] 98.1 F (36.7 C) (02/09 0800) Pulse Rate:  [76-93] 78  (02/09 0800) Resp:  [16-22] 19  (02/09 0827) BP: (110-161)/(63-97) 115/73 mmHg (02/09 0800) SpO2:  [93 %-100 %] 93 % (02/09 0827) Weight:  [134 lb 0.6 oz (60.8 kg)] 134 lb 0.6 oz (60.8 kg) (02/09 0422)   Intake/Output Summary (Last 24 hours) at 05/11/11 1202 Last data filed at 05/11/11 1100  Gross per 24 hour  Intake 1133.5 ml  Output   2205 ml  Net -1071.5 ml       General appearance: alert, cooperative and no distress Heart: regular rate and rhythm Lungs: mildly diminished Right base Abdomen: mild LLQ tenderness, no distension, + BS Extremities: no edema Wound: incison healing well  Lab Results:  Basename 05/10/11 0428 05/09/11 0431  NA 139 137  K 3.7 4.0  CL 104 105  CO2 28 26  GLUCOSE 108* 93  BUN 10 8  CREATININE 0.81 0.76  CALCIUM 9.0 8.8  MG -- --  PHOS -- --    Basename 05/10/11 0428  AST 33  ALT 18  ALKPHOS 51  BILITOT 0.4  PROT 5.7*  ALBUMIN 3.1*   No results found for this basename: LIPASE:2,AMYLASE:2 in the last 72 hours  Basename 05/10/11 0428 05/09/11 0431  WBC 9.2 10.3  NEUTROABS -- --  HGB 11.3* 11.8*  HCT 33.2* 34.5*  MCV 93.8 93.0  PLT 234 234   No results found for this basename: CKTOTAL:4,CKMB:4,TROPONINI:4 in the last 72 hours No components found with this basename: POCBNP:3 No results found for this basename: DDIMER in the last 72 hours No results found for this basename: HGBA1C in the last 72 hours No results found for this basename: CHOL,HDL,LDLCALC,TRIG,CHOLHDL in the last 72 hours No results found for this basename:  TSH,T4TOTAL,FREET3,T3FREE,THYROIDAB in the last 72 hours No results found for this basename: VITAMINB12,FOLATE,FERRITIN,TIBC,IRON,RETICCTPCT in the last 72 hours  Medications: Scheduled    . aspirin  81 mg Oral BID  . bisacodyl  10 mg Oral Daily  . budesonide-formoterol  2 puff Inhalation BID  . enoxaparin  40 mg Subcutaneous Q24H  . ethambutol  1,200 mg Oral Daily  . fentaNYL   Intravenous Q4H  . isoniazid  300 mg Oral Daily  . mulitivitamin with minerals  1 tablet Oral Daily  . nicotine  14 mg Transdermal Daily  . pyrazinamide  1,500 mg Oral Daily  . pyrazinamide  500 mg Oral Once  . pyridOXINE  50 mg Oral Daily  . rifampin  300 mg Oral Q12H  . rosuvastatin  10 mg Oral Daily     Radiology/Studies:  Dg Chest Port 1 View  05/11/2011  *RADIOLOGY REPORT*  Clinical Data: Evaluate chest tube placement  PORTABLE CHEST - 1 VIEW  Comparison: 05/10/2011  Findings:  There is a right IJ catheter with tip in the cavoatrial junction.  Right-sided chest tube is identified.  No significant change in the 20% right-sided pneumothorax.  Decrease in right chest wall subcutaneous emphysema.  Left lung is  clear.  IMPRESSION:  1.  No significant change in right-sided pneumothorax.  Stable chest tube. 2.  Decrease in chest wall subcutaneous emphysema.  Original Report Authenticated By: Rosealee Albee, M.D.   Dg Chest Port 1 View  05/10/2011  *RADIOLOGY REPORT*  Clinical Data: Postop (VATS)  PORTABLE CHEST - 1 VIEW  Comparison: 05/09/2011; 05/08/2011; 05/06/2011  Findings:  Grossly unchanged cardiac silhouette and mediastinal contours. Stable positioning of support apparatus. No change to minimal decrease in small right apical pneumothorax.  The amount of right lateral chest wall and supraclavicular fossa subcutaneous emphysema has minimally increased in the interval.  There is persistent mild elevation of the right hemidiaphragm with grossly unchanged right basilar heterogeneous opacities.  Minimal increase in  heterogeneous opacities within the right mid lung.  The left hemithorax is unchanged.  Grossly unchanged bones.  IMPRESSION: 1.  Stable position of support apparatus.  No change to minimal decrease in right-sided small pneumothorax. 2.  Minimal increase in right mid lung heterogeneous opacities, possibly atelectasis. 3.  Minimal increase in amount of right lateral chest wall subcutaneous emphysema.  Original Report Authenticated By: Waynard Reeds, M.D.   Chest tube no air leak, 30 cc drainage INR: Will add last result for INR, ABG once components are confirmed Will add last 4 CBG results once components are confirmed  Assessment/Plan: S/P Procedure(s) (LRB): VIDEO BRONCHOSCOPY (N/A) VIDEO ASSISTED THORACOSCOPY (VATS)/WEDGE RESECTION (Right)  1. Right pntx stable, keep chest tube 2. Push pulm toilet/rehab as able 3. Cultures unremarkable so far, HIV,Hepatitis screens negative GOLD,WAYNE E 2/9/201312:02 PM    patient examined and medical record reviewed,agree with above note. VAN TRIGT III,Tobenna Needs 05/11/2011   Will D/C chest tube in am

## 2011-05-12 ENCOUNTER — Inpatient Hospital Stay (HOSPITAL_COMMUNITY): Payer: 59

## 2011-05-12 MED ORDER — DIPHENHYDRAMINE HCL 12.5 MG/5ML PO ELIX
12.5000 mg | ORAL_SOLUTION | Freq: Four times a day (QID) | ORAL | Status: DC | PRN
  Filled 2011-05-12: qty 5

## 2011-05-12 MED ORDER — RIFAMPIN 300 MG PO CAPS: 300.0000 mg | ORAL_CAPSULE | Freq: Two times a day (BID) | ORAL | Status: DC

## 2011-05-12 MED ORDER — NICOTINE 14 MG/24HR TD PT24: 1.0000 | MEDICATED_PATCH | Freq: Every day | TRANSDERMAL | Status: DC

## 2011-05-12 MED ORDER — ONDANSETRON HCL 4 MG/2ML IJ SOLN
4.0000 mg | Freq: Four times a day (QID) | INTRAMUSCULAR | Status: DC | PRN
  Administered 2011-05-13: 4 mg via INTRAVENOUS
  Filled 2011-05-12: qty 2

## 2011-05-12 MED ORDER — ETHAMBUTOL HCL 400 MG PO TABS: 1200.0000 mg | ORAL_TABLET | Freq: Every day | ORAL | Status: DC

## 2011-05-12 MED ORDER — FENTANYL 10 MCG/ML IV SOLN
INTRAVENOUS | Status: DC
  Administered 2011-05-12 (×2): 15 ug via INTRAVENOUS
  Filled 2011-05-12: qty 50

## 2011-05-12 MED ORDER — DIPHENHYDRAMINE HCL 50 MG/ML IJ SOLN
12.5000 mg | Freq: Four times a day (QID) | INTRAMUSCULAR | Status: DC | PRN
  Administered 2011-05-12: 12.5 mg via INTRAVENOUS
  Filled 2011-05-12 (×2): qty 1

## 2011-05-12 MED ORDER — ISONIAZID 300 MG PO TABS: 300.0000 mg | ORAL_TABLET | Freq: Every day | ORAL | Status: AC

## 2011-05-12 MED ORDER — NALOXONE HCL 0.4 MG/ML IJ SOLN: 0.4000 mg | INTRAMUSCULAR | Status: DC | PRN

## 2011-05-12 MED ORDER — PYRIDOXINE HCL 50 MG PO TABS: 50.0000 mg | ORAL_TABLET | Freq: Every day | ORAL | Status: DC

## 2011-05-12 MED ORDER — SODIUM CHLORIDE 0.9 % IJ SOLN: 9.0000 mL | INTRAMUSCULAR | Status: DC | PRN

## 2011-05-12 MED ORDER — PYRAZINAMIDE 500 MG PO TABS: 1500.0000 mg | ORAL_TABLET | Freq: Every day | ORAL | Status: DC

## 2011-05-12 MED ORDER — TRAMADOL HCL 50 MG PO TABS: 50.0000 mg | ORAL_TABLET | Freq: Four times a day (QID) | ORAL | Status: AC | PRN

## 2011-05-12 NOTE — Progress Notes (Signed)
MD/N of STAT PCX report after chest tube removal, no new orders received.

## 2011-05-12 NOTE — Progress Notes (Addendum)
[address]            [place] [address]          [phone]     4 Days Post-Op  Procedure(s) (LRB): VIDEO BRONCHOSCOPY (N/A) VIDEO ASSISTED THORACOSCOPY (VATS)/WEDGE RESECTION (Right) Subjective: Feels stronger, minor SOB  Objective  Telemetry NSR  Temp:  [97.6 F (36.4 C)-98.7 F (37.1 C)] 98.7 F (37.1 C) (02/10 0735) Pulse Rate:  [67-70] 70  (02/10 0400) Resp:  [17-20] 20  (02/10 1113) BP: (107-129)/(62-73) 111/70 mmHg (02/10 0735) SpO2:  [92 %-98 %] 98 % (02/10 1113)   Intake/Output Summary (Last 24 hours) at 05/12/11 1201 Last data filed at 05/12/11 1000  Gross per 24 hour  Intake 1244.5 ml  Output    600 ml  Net  644.5 ml       General appearance: alert, cooperative and no distress Heart: regular rate and rhythm Lungs: clear to auscultation bilaterally Abdomen: soft, non-tender; bowel sounds normal; no masses,  no organomegaly Wound: incis healing well  Lab Results:  Basename 05/10/11 0428  NA 139  K 3.7  CL 104  CO2 28  GLUCOSE 108*  BUN 10  CREATININE 0.81  CALCIUM 9.0  MG --  PHOS --    Basename 05/10/11 0428  AST 33  ALT 18  ALKPHOS 51  BILITOT 0.4  PROT 5.7*  ALBUMIN 3.1*   No results found for this basename: LIPASE:2,AMYLASE:2 in the last 72 hours  Basename 05/10/11 0428  WBC 9.2  NEUTROABS --  HGB 11.3*  HCT 33.2*  MCV 93.8  PLT 234   No results found for this basename: CKTOTAL:4,CKMB:4,TROPONINI:4 in the last 72 hours No components found with this basename: POCBNP:3 No results found for this basename: DDIMER in the last 72 hours No results found for this basename: HGBA1C in the last 72 hours No results found for this basename: CHOL,HDL,LDLCALC,TRIG,CHOLHDL in the last 72 hours No results found for this basename: TSH,T4TOTAL,FREET3,T3FREE,THYROIDAB in the last 72 hours No results found for this basename: VITAMINB12,FOLATE,FERRITIN,TIBC,IRON,RETICCTPCT in the last 72  hours  Medications: Scheduled    . aspirin  81 mg Oral BID  . bisacodyl  10 mg Oral Daily  . budesonide-formoterol  2 puff Inhalation BID  . enoxaparin  40 mg Subcutaneous Q24H  . ethambutol  1,200 mg Oral Daily  . fentaNYL   Intravenous Q4H  . isoniazid  300 mg Oral Daily  . mulitivitamin with minerals  1 tablet Oral Daily  . nicotine  14 mg Transdermal Daily  . pyrazinamide  1,500 mg Oral Daily  . pyridOXINE  50 mg Oral Daily  . rifampin  300 mg Oral Q12H  . rosuvastatin  10 mg Oral Daily  . DISCONTD: fentaNYL   Intravenous Q4H     Radiology/Studies:  Dg Chest Port 1 View  05/12/2011  *RADIOLOGY REPORT*  Clinical Data: Status post VATS surgery with wedge resection of right upper lobe lung nodule.  PORTABLE CHEST - 1 VIEW  Comparison: 05/11/2011  Findings: Single right chest tube remains in place.  There is decrease in size of the right apical pneumothorax now estimated at 5% volume.  Amount of subcutaneous emphysema in the chest wall also appears decreased.  Overall improved right lung volume.  No edema or pleural fluid.  Stable positioning of central line.  IMPRESSION: Reduction in right apical pneumothorax and subcutaneous emphysema. Improved right  lung expansion.  Original Report Authenticated By: Reola Calkins, M.D.   Dg Chest Port 1 View  05/11/2011  *RADIOLOGY REPORT*  Clinical Data: Evaluate chest tube placement  PORTABLE CHEST - 1 VIEW  Comparison: 05/10/2011  Findings:  There is a right IJ catheter with tip in the cavoatrial junction.  Right-sided chest tube is identified.  No significant change in the 20% right-sided pneumothorax.  Decrease in right chest wall subcutaneous emphysema.  Left lung is clear.  IMPRESSION:  1.  No significant change in right-sided pneumothorax.  Stable chest tube. 2.  Decrease in chest wall subcutaneous emphysema.  Original Report Authenticated By: Rosealee Albee, M.D.    INR: Will add last result for INR, ABG once components are  confirmed Will add last 4 CBG results once components are confirmed  Assessment/Plan: S/P Procedure(s) (LRB): VIDEO BRONCHOSCOPY (N/A) VIDEO ASSISTED THORACOSCOPY (VATS)/WEDGE RESECTION (Right) 1. Doing well, d/c chest tube 2. Cont rehab/pulm toilet   LOS: 4 days    GOLD,WAYNE E 2/10/201312:01 PM    patient examined and medical record reviewed,agree with above note. VAN TRIGT III,Makynzi [place] 05/12/2011

## 2011-05-12 NOTE — Progress Notes (Signed)
18 mcq of fentynal wasted in sink, [employer] RN witnessed.

## 2011-05-12 NOTE — Progress Notes (Signed)
CT d/c per MD order, pt tol well, pt verbalized understanding if becomes SOB, CP, or any concerns to notify RN, RN will continue to monitor.

## 2011-05-13 ENCOUNTER — Inpatient Hospital Stay (HOSPITAL_COMMUNITY): Payer: 59

## 2011-05-13 DIAGNOSIS — D381 Neoplasm of uncertain behavior of trachea, bronchus and lung: Secondary | ICD-10-CM

## 2011-05-13 MED ORDER — RIFAMPIN 300 MG PO CAPS
300.0000 mg | ORAL_CAPSULE | ORAL | Status: AC
  Administered 2011-05-13: 300 mg via ORAL
  Filled 2011-05-13: qty 1

## 2011-05-13 MED ORDER — BUDESONIDE-FORMOTEROL FUMARATE 160-4.5 MCG/ACT IN AERO
2.0000 | INHALATION_SPRAY | Freq: Two times a day (BID) | RESPIRATORY_TRACT | Status: DC
  Administered 2011-05-13 – 2011-05-14 (×2): 2 via RESPIRATORY_TRACT

## 2011-05-13 MED ORDER — RIFAMPIN 300 MG PO CAPS
600.0000 mg | ORAL_CAPSULE | Freq: Every day | ORAL | Status: DC
  Administered 2011-05-14: 600 mg via ORAL
  Filled 2011-05-13: qty 2

## 2011-05-13 NOTE — Progress Notes (Addendum)
                    [address]            [place] [address]          [phone]     5 Days Post-Op Procedure(s) (LRB): VIDEO BRONCHOSCOPY (N/A) VIDEO ASSISTED THORACOSCOPY (VATS)/WEDGE RESECTION (Right)  Subjective: C/o nausea, Tb meds making him sick.  Still quite sore.  Objective: Vital signs in last 24 hours: Patient Vitals for the past 24 hrs:  BP Temp Temp src Pulse Resp SpO2  05/13/11 0400 126/70 mmHg 98.6 F (37 C) Oral 75  18  98 %  05/12/11 2345 105/71 mmHg 98.9 F (37.2 C) Oral 72  20  97 %  05/12/11 2000 133/77 mmHg 97.8 F (36.6 C) Oral 74  25  98 %  05/12/11 1739 140/79 mmHg 97.9 F (36.6 C) Oral - 22  97 %  05/12/11 1214 122/57 mmHg 98 F (36.7 C) Oral - 23  97 %  05/12/11 1113 - - - - 20  98 %  05/12/11 0800 - - - - 19  98 %   Current Weight  05/11/11 60.8 kg (134 lb 0.6 oz)     Intake/Output from previous day: 02/10 0701 - 02/11 0700 In: 1240 [P.O.:960; I.V.:280] Out: 1200 [Urine:1200]    PHYSICAL EXAM:  Heart: RRR Lungs: slightly diminished BS in bases Wound: clean and dry  Lab Results: Sputum negative for AFB so far Other cultures negative Hep B, C negative HIV negative  CXR: stable R apical ptx  Assessment/Plan: S/P Procedure(s) (LRB): VIDEO BRONCHOSCOPY (N/A) VIDEO ASSISTED THORACOSCOPY (VATS)/WEDGE RESECTION (Right) R ptx- stable.  Follow up CXR in am. ID- Continue tx for suspected Tb. ID following. Quantiferon gold, PCR pending. Continue pulm toilet, ambulation. ? Home soon.    LOS: 5 days    COLLINS,GINA H 05/13/2011   Home one or two days Interferon Gamma Release Assay -NEGATIVE

## 2011-05-13 NOTE — Progress Notes (Addendum)
INFECTIOUS DISEASE PROGRESS NOTE  ID: Caleb [name] is a 61 y.o. male with   Active Problems:  Chest mass  COPD (chronic obstructive pulmonary disease)  Tobacco abuse  Subjective: Without complaints.    Abtx:  Anti-infectives     Start     Dose/Rate Route Frequency Ordered Stop   05/12/11 0000   ethambutol (MYAMBUTOL) 400 MG tablet        1,200 mg Oral Daily 05/12/11 1328 05/11/12 2359   05/12/11 0000   isoniazid (NYDRAZID) 300 MG tablet        300 mg Oral Daily 05/12/11 1328 05/26/11 2359   05/12/11 0000   pyrazinamide 500 MG tablet        1,500 mg Oral Daily 05/12/11 1328 05/11/12 2359   05/12/11 0000   rifampin (RIFADIN) 300 MG capsule        300 mg Oral Every 12 hours 05/12/11 1328 05/22/11 2359   05/11/11 1000   ethambutol (MYAMBUTOL) tablet 1,200 mg        1,200 mg Oral Daily 05/10/11 1006     05/10/11 1300   pyrazinamide tablet 500 mg        500 mg Oral  Once 05/10/11 1158 05/10/11 1411   05/10/11 1015   pyrazinamide tablet 1,500 mg        1,500 mg Oral Daily 05/10/11 1006     05/09/11 2200   rifampin (RIFADIN) capsule 300 mg        300 mg Oral Every 12 hours 05/09/11 1606     05/09/11 1700   isoniazid (NYDRAZID) tablet 300 mg        300 mg Oral Daily 05/09/11 1606     05/09/11 1700   pyrazinamide tablet 1,000 mg  Status:  Discontinued        1,000 mg Oral Daily 05/09/11 1606 05/10/11 1006   05/09/11 1700   ethambutol (MYAMBUTOL) tablet 900 mg  Status:  Discontinued        900 mg Oral Daily 05/09/11 1622 05/10/11 1006   05/09/11 1615   ethambutol (MYAMBUTOL) tablet 900 mg  Status:  Discontinued        15 mg/kg  59.7 kg Oral Daily 05/09/11 1606 05/09/11 1621   05/08/11 2100   cefUROXime (ZINACEF) 1.5 g in dextrose 5 % 50 mL IVPB        1.5 g 100 mL/hr over 30 Minutes Intravenous Every 12 hours 05/08/11 1800 05/09/11 0922   05/07/11 1430   cefUROXime (ZINACEF) 1.5 g in dextrose 5 % 50 mL IVPB        1.5 g 100 mL/hr over 30 Minutes Intravenous 60 min  pre-op 05/07/11 1427 05/08/11 0848          Medications:  Scheduled:   . aspirin  81 mg Oral BID  . bisacodyl  10 mg Oral Daily  . budesonide-formoterol  2 puff Inhalation BID  . enoxaparin  40 mg Subcutaneous Q24H  . ethambutol  1,200 mg Oral Daily  . isoniazid  300 mg Oral Daily  . mulitivitamin with minerals  1 tablet Oral Daily  . nicotine  14 mg Transdermal Daily  . pyrazinamide  1,500 mg Oral Daily  . pyridOXINE  50 mg Oral Daily  . rifampin  300 mg Oral NOW  . rosuvastatin  10 mg Oral Daily  . DISCONTD: fentaNYL   Intravenous Q4H  . DISCONTD: rifampin  300 mg Oral Q12H    Objective: Vital signs in last 24 hours:  Temp:  [97.8 F (36.6 C)-98.9 F (37.2 C)] 98.2 F (36.8 C) (02/11 0800) Pulse Rate:  [72-75] 72  (02/11 0800) Resp:  [18-25] 18  (02/11 0400) BP: (105-140)/(57-79) 123/66 mmHg (02/11 0800) SpO2:  [95 %-98 %] 95 % (02/11 0800)   General appearance: alert, cooperative and no distress Resp: diminished breath sounds apex - bilateral Cardio: regular rate and rhythm GI: normal findings: bowel sounds normal and soft, non-tender  Lab Results No results found for this basename: WBC:2,HGB:2,HCT:2,PLATELETS:2,NA:2,K:2,CL:2,CO2:2,BUN:2,CREATININE:2,GLU:2 in the last 72 hours Liver Panel No results found for this basename: PROT:2,ALBUMIN:2,AST:2,ALT:2,ALKPHOS:2,BILITOT:2,BILIDIR:2,IBILI:2 in the last 72 hours Sedimentation Rate No results found for this basename: ESRSEDRATE in the last 72 hours C-Reactive Protein No results found for this basename: CRP:2 in the last 72 hours  Microbiology: Recent Results (from the past 240 hour(s))  SURGICAL PCR SCREEN     Status: Normal   Collection Time   05/06/11  3:25 PM      Component Value Range Status Comment   MRSA, PCR NEGATIVE  NEGATIVE  Final    Staphylococcus aureus NEGATIVE  NEGATIVE  Final   AFB CULTURE WITH SMEAR     Status: Normal (Preliminary result)   Collection Time   05/08/11 10:00 AM      Component  Value Range Status Comment   Specimen Description TISSUE LUNG RIGHT UPPER   Final    Special Requests NONE   Final    ACID FAST SMEAR     Final    Value: 2+ ACID FAST BACILLI SEEN CRITICAL RESULT CALLED TO, READ BACK BY AND VERIFIED WITH: JOSH DRAPER @ 0229 ON 05/09/11 BY WILSN CRITICAL RESULT CALLED TO, READ BACK BY AND VERIFIED WITH: MARLAEN DAWALT @ [facility] 05/09/11   Culture     Final    Value: CULTURE WILL BE EXAMINED FOR 6 WEEKS BEFORE ISSUING A FINAL REPORT   Report Status PENDING   Incomplete   TISSUE CULTURE     Status: Normal   Collection Time   05/08/11 10:00 AM      Component Value Range Status Comment   Specimen Description TISSUE LUNG RIGHT UPPER   Final    Special Requests NONE   Final    Gram Stain     Final    Value: NO WBC SEEN     NO SQUAMOUS EPITHELIAL CELLS SEEN     NO ORGANISMS SEEN   Culture NO GROWTH 3 DAYS   Final    Report Status 05/11/2011 FINAL   Final   FUNGUS CULTURE W SMEAR     Status: Normal (Preliminary result)   Collection Time   05/08/11 10:00 AM      Component Value Range Status Comment   Specimen Description TISSUE LUNG RIGHT UPPER   Final    Special Requests NONE   Final    Fungal Smear NO YEAST OR FUNGAL ELEMENTS SEEN   Final    Culture CULTURE IN PROGRESS FOR FOUR WEEKS   Final    Report Status PENDING   Incomplete   AFB CULTURE WITH SMEAR     Status: Normal (Preliminary result)   Collection Time   05/11/11 10:00 AM      Component Value Range Status Comment   Specimen Description SPUTUM   Final    Special Requests Normal   Final    ACID FAST SMEAR NO ACID FAST BACILLI SEEN   Final    Culture     Final    Value: CULTURE WILL  BE EXAMINED FOR 6 WEEKS BEFORE ISSUING A FINAL REPORT   Report Status PENDING   Incomplete     Studies/Results: Dg Chest 2 View  05/13/2011  *RADIOLOGY REPORT*  Clinical Data: Chest pain  CHEST - 2 VIEW  Comparison: 05/12/2011  Findings: Evidence of partial right upper lobectomy again noted with stable small right  apical pneumothorax.  Trace right basilar pleural air is also noted.  Subcutaneous emphysema is stable on the right.  No mediastinal shift.  Right IJ central line tip terminates over the mid SVC.  Heart size is normal.  Left lung is clear but hyperinflated and lucent, suggestive of COPD.  IMPRESSION: Stable small predominately right apical pneumothorax without mediastinal shift after partial right upper lobectomy.  Original Report Authenticated By: Harrel Lemon, M.D.   Dg Chest Port 1 View  05/12/2011  *RADIOLOGY REPORT*  Clinical Data: Status post VATS surgery with wedge resection of right upper lobe lung nodule.  PORTABLE CHEST - 1 VIEW  Comparison: 05/11/2011  Findings: Single right chest tube remains in place.  There is decrease in size of the right apical pneumothorax now estimated at 5% volume.  Amount of subcutaneous emphysema in the chest wall also appears decreased.  Overall improved right lung volume.  No edema or pleural fluid.  Stable positioning of central line.  IMPRESSION: Reduction in right apical pneumothorax and subcutaneous emphysema. Improved right lung expansion.  Original Report Authenticated By: Reola Calkins, M.D.   Dg Chest Port 1v Same Day  05/12/2011  *RADIOLOGY REPORT*  Clinical Data: Follow up  PORTABLE CHEST - 1 VIEW SAME DAY  Comparison: Prior films same day  Findings: Cardiomediastinal silhouette is stable.  Stable right chest wall subcutaneous emphysema.  Stable right IJ central line position.  Mild right basilar atelectasis.  Stable postsurgical changes in  right upper lobe.  There is slight increase right apical pneumothorax measures between five and 10%.  IMPRESSION: Mild increase in size right apical pneumothorax measuring between five and 10%.  Right basilar atelectasis.  Stable subcutaneous emphysema right chest wall.  Stable right IJ central line position.  Original Report Authenticated By: Natasha Mead, M.D.     Assessment/Plan:  Tuberculosis  On 4 drugs  (I/E/R/P) and B6 Watch LFTS- normal so far. HIV, hepatitis B/C negative   Quantiferon gold pending. TB PCR sent to [facility] clinic today. Probably back in ~1 week.  Will change his rifampin to QD. Will contact health dept in [place] [phone] phone, [phone] fax). To set up f/u appt. Explained to pt.   Spoke with health dept, they will do home visit when he gets out.     Johny Sax Infectious Diseases [phone] 05/13/2011, 10:23 AM

## 2011-05-14 ENCOUNTER — Inpatient Hospital Stay (HOSPITAL_COMMUNITY): Payer: 59

## 2011-05-14 ENCOUNTER — Encounter (HOSPITAL_COMMUNITY): Payer: Self-pay

## 2011-05-14 MED ORDER — RIFAMPIN 300 MG PO CAPS: 600.0000 mg | ORAL_CAPSULE | Freq: Every day | ORAL | Status: AC

## 2011-05-14 MED ORDER — BUDESONIDE-FORMOTEROL FUMARATE 160-4.5 MCG/ACT IN AERO: 2.0000 | INHALATION_SPRAY | Freq: Two times a day (BID) | RESPIRATORY_TRACT | Status: DC

## 2011-05-14 NOTE — Progress Notes (Signed)
INFECTIOUS DISEASE PROGRESS NOTE  ID: Caleb [name] is a 61 y.o. male with   Active Problems:  Chest mass  COPD (chronic obstructive pulmonary disease)  Tobacco abuse  Subjective: without complaints, multiple questions about going home.   Abtx:  Anti-infectives     Start     Dose/Rate Route Frequency Ordered Stop   05/14/11 1000   rifampin (RIFADIN) capsule 600 mg        600 mg Oral Daily 05/13/11 1040     05/13/11 1045   rifampin (RIFADIN) capsule 300 mg        300 mg Oral NOW 05/13/11 1037 05/13/11 1130   05/12/11 0000   ethambutol (MYAMBUTOL) 400 MG tablet        1,200 mg Oral Daily 05/12/11 1328 05/11/12 2359   05/12/11 0000   isoniazid (NYDRAZID) 300 MG tablet        300 mg Oral Daily 05/12/11 1328 05/26/11 2359   05/12/11 0000   pyrazinamide 500 MG tablet        1,500 mg Oral Daily 05/12/11 1328 05/11/12 2359   05/12/11 0000   rifampin (RIFADIN) 300 MG capsule        300 mg Oral Every 12 hours 05/12/11 1328 05/22/11 2359   05/11/11 1000   ethambutol (MYAMBUTOL) tablet 1,200 mg        1,200 mg Oral Daily 05/10/11 1006     05/10/11 1300   pyrazinamide tablet 500 mg        500 mg Oral  Once 05/10/11 1158 05/10/11 1411   05/10/11 1015   pyrazinamide tablet 1,500 mg        1,500 mg Oral Daily 05/10/11 1006     05/09/11 2200   rifampin (RIFADIN) capsule 300 mg  Status:  Discontinued        300 mg Oral Every 12 hours 05/09/11 1606 05/13/11 1036   05/09/11 1700   isoniazid (NYDRAZID) tablet 300 mg        300 mg Oral Daily 05/09/11 1606     05/09/11 1700   pyrazinamide tablet 1,000 mg  Status:  Discontinued        1,000 mg Oral Daily 05/09/11 1606 05/10/11 1006   05/09/11 1700   ethambutol (MYAMBUTOL) tablet 900 mg  Status:  Discontinued        900 mg Oral Daily 05/09/11 1622 05/10/11 1006   05/09/11 1615   ethambutol (MYAMBUTOL) tablet 900 mg  Status:  Discontinued        15 mg/kg  59.7 kg Oral Daily 05/09/11 1606 05/09/11 1621   05/08/11 2100   cefUROXime  (ZINACEF) 1.5 g in dextrose 5 % 50 mL IVPB        1.5 g 100 mL/hr over 30 Minutes Intravenous Every 12 hours 05/08/11 1800 05/09/11 0922   05/07/11 1430   cefUROXime (ZINACEF) 1.5 g in dextrose 5 % 50 mL IVPB        1.5 g 100 mL/hr over 30 Minutes Intravenous 60 min pre-op 05/07/11 1427 05/08/11 0848          Medications:  Scheduled:   . aspirin  81 mg Oral BID  . bisacodyl  10 mg Oral Daily  . budesonide-formoterol  2 puff Inhalation BID  . enoxaparin  40 mg Subcutaneous Q24H  . ethambutol  1,200 mg Oral Daily  . isoniazid  300 mg Oral Daily  . mulitivitamin with minerals  1 tablet Oral Daily  . nicotine  14 mg Transdermal  Daily  . pyrazinamide  1,500 mg Oral Daily  . pyridOXINE  50 mg Oral Daily  . rifampin  600 mg Oral Daily  . rosuvastatin  10 mg Oral Daily  . DISCONTD: budesonide-formoterol  2 puff Inhalation BID    Objective: Vital signs in last 24 hours: Temp:  [98.2 F (36.8 C)-99 F (37.2 C)] 98.6 F (37 C) (02/12 0448) Pulse Rate:  [65-66] 66  (02/11 2343) Resp:  [16-21] 19  (02/12 1200) BP: (123-142)/(68-80) 123/76 mmHg (02/12 0448) SpO2:  [94 %-96 %] 96 % (02/12 0822)   General appearance: alert, cooperative and no distress Resp: diminished breath sounds bibasilar Chest wall: no tenderness, well helaing wound, R posterior Cardio: regular rate and rhythm GI: normal findings: bowel sounds normal and soft, non-tender  Lab Results No results found for this basename: WBC:2,HGB:2,HCT:2,PLATELETS:2,NA:2,K:2,CL:2,CO2:2,BUN:2,CREATININE:2,GLU:2 in the last 72 hours Liver Panel No results found for this basename: PROT:2,ALBUMIN:2,AST:2,ALT:2,ALKPHOS:2,BILITOT:2,BILIDIR:2,IBILI:2 in the last 72 hours Sedimentation Rate No results found for this basename: ESRSEDRATE in the last 72 hours C-Reactive Protein No results found for this basename: CRP:2 in the last 72 hours  Microbiology: Recent Results (from the past 240 hour(s))  SURGICAL PCR SCREEN     Status:  Normal   Collection Time   05/06/11  3:25 PM      Component Value Range Status Comment   MRSA, PCR NEGATIVE  NEGATIVE  Final    Staphylococcus aureus NEGATIVE  NEGATIVE  Final   AFB CULTURE WITH SMEAR     Status: Normal (Preliminary result)   Collection Time   05/08/11 10:00 AM      Component Value Range Status Comment   Specimen Description TISSUE LUNG RIGHT UPPER   Final    Special Requests NONE   Final    ACID FAST SMEAR     Final    Value: 2+ ACID FAST BACILLI SEEN CRITICAL RESULT CALLED TO, READ BACK BY AND VERIFIED WITH: JOSH DRAPER @ 0229 ON 05/09/11 BY WILSN CRITICAL RESULT CALLED TO, READ BACK BY AND VERIFIED WITH: MARLAEN DAWALT @ [facility] 05/09/11   Culture     Final    Value: CULTURE WILL BE EXAMINED FOR 6 WEEKS BEFORE ISSUING A FINAL REPORT   Report Status PENDING   Incomplete   TISSUE CULTURE     Status: Normal   Collection Time   05/08/11 10:00 AM      Component Value Range Status Comment   Specimen Description TISSUE LUNG RIGHT UPPER   Final    Special Requests NONE   Final    Gram Stain     Final    Value: NO WBC SEEN     NO SQUAMOUS EPITHELIAL CELLS SEEN     NO ORGANISMS SEEN   Culture NO GROWTH 3 DAYS   Final    Report Status 05/11/2011 FINAL   Final   FUNGUS CULTURE W SMEAR     Status: Normal (Preliminary result)   Collection Time   05/08/11 10:00 AM      Component Value Range Status Comment   Specimen Description TISSUE LUNG RIGHT UPPER   Final    Special Requests NONE   Final    Fungal Smear NO YEAST OR FUNGAL ELEMENTS SEEN   Final    Culture CULTURE IN PROGRESS FOR FOUR WEEKS   Final    Report Status PENDING   Incomplete   AFB CULTURE WITH SMEAR     Status: Normal (Preliminary result)   Collection Time  05/11/11 10:00 AM      Component Value Range Status Comment   Specimen Description SPUTUM   Final    Special Requests Normal   Final    ACID FAST SMEAR NO ACID FAST BACILLI SEEN   Final    Culture     Final    Value: CULTURE WILL BE EXAMINED FOR 6 WEEKS  BEFORE ISSUING A FINAL REPORT   Report Status PENDING   Incomplete     Studies/Results: Dg Chest 2 View  05/14/2011  *RADIOLOGY REPORT*  Clinical Data: Follow up right pneumothorax, history hypertension, smoking, COPD/emphysema  CHEST - 2 VIEW  Comparison: 05/13/2011  Findings: Prior surgical resection right apex. Normal heart size, mediastinal contours, and pulmonary vascularity. Persistent right apical pneumothorax little changed. Right chest wall emphysema extending into right cervical region again identified. Lungs emphysematous with minimal atelectasis at right base. No pulmonary infiltrate. Small right pleural effusion. Bones demineralized.  IMPRESSION: Postsurgical changes right upper lobe with persistent right apex pneumothorax. Underlying emphysematous changes with minimal atelectasis and small effusion at right base.  Original Report Authenticated By: Lollie Marrow, M.D.   Dg Chest 2 View  05/13/2011  *RADIOLOGY REPORT*  Clinical Data: Chest pain  CHEST - 2 VIEW  Comparison: 05/12/2011  Findings: Evidence of partial right upper lobectomy again noted with stable small right apical pneumothorax.  Trace right basilar pleural air is also noted.  Subcutaneous emphysema is stable on the right.  No mediastinal shift.  Right IJ central line tip terminates over the mid SVC.  Heart size is normal.  Left lung is clear but hyperinflated and lucent, suggestive of COPD.  IMPRESSION: Stable small predominately right apical pneumothorax without mediastinal shift after partial right upper lobectomy.  Original Report Authenticated By: Harrel Lemon, M.D.   Dg Chest Port 1v Same Day  05/12/2011  *RADIOLOGY REPORT*  Clinical Data: Follow up  PORTABLE CHEST - 1 VIEW SAME DAY  Comparison: Prior films same day  Findings: Cardiomediastinal silhouette is stable.  Stable right chest wall subcutaneous emphysema.  Stable right IJ central line position.  Mild right basilar atelectasis.  Stable postsurgical changes in   right upper lobe.  There is slight increase right apical pneumothorax measures between five and 10%.  IMPRESSION: Mild increase in size right apical pneumothorax measuring between five and 10%.  Right basilar atelectasis.  Stable subcutaneous emphysema right chest wall.  Stable right IJ central line position.  Original Report Authenticated By: Natasha Mead, M.D.     Assessment/Plan: ? TB, granulomatous lung lesion Day 6 of I/R/E/P Spoke with health dept and they will come to his house tomorrow and give him his rx there.  Instructed him to wear a mask while in public, with other people.   Caleb [name] Infectious Diseases [phone] 05/14/2011, 1:12 PM

## 2011-05-14 NOTE — Discharge Summary (Signed)
[address]            [place] [address]          [phone]      Caleb [name] February 24, 1951 61 y.o. [id number]  05/08/2011   Delight Ovens, MD  R[facility]  History of Present Illness:  Patient is a 61 year old smoker who developed upper respiratory infection symptoms in November of 2012. The symptoms were predominantly right upper chest wall discomfort and cough. Patient did lose almost 20 pounds in weight. The symptoms improved and then returned again in December. At that time he saw his primary care doctor and a chest x-ray was obtained there was a new right upper lobe lung nodule appreciated there was not present on previous chest x-rays a CT scan of the chest and PET scan was performed.  The patient's case was discussed in multidisciplinary thoracic oncology clinic, and films were reviewed. The patient is referred to the surgical clinic to discuss treatment options.  He has known underlying emphysema with long-term smoking up to 40 years recently he has decreased to 5-6 cigarettes a day.  He's had no past history of myocardial infarction 3-4 years ago he underwent cardiac catheterization in [facility] and was told that he had some minor coronary disease but nothing requiring invasive treatment.  Since last seen the patient had the pulmonary function studies performed and saw Dr. Excell Seltzer for cardiac clearance and cardiac stress test.  He has not been smoking for the past 2 weeks.  Current Activity/ Functional Status:  Patient is independent with mobility/ambulation, transfers, ADL's, IADL's.  Past Medical History   Diagnosis  Date   .  Hypertension    .  Emphysema    .  Hyperlipidemia    .  Allergic rhinitis     Past Surgical History   Procedure  Date   .  Eye muscle surgery x3      2 left eye, 1 right eye   .  Knee surgery, left    .  Back surgery     Family History   Problem  Relation  Age of Onset   .  Emphysema  Mother    .   Allergies  Brother    .  Allergies  Sister       x2    .  Heart disease  Sister  Died MI age 71 69   .  Heart failure  Father  Died 75 MI   .  Heart disease  Mother  Died 36 MI   .  Esophageal cancer  Cousin    .  Stomach cancer  Cousin    .  Lymphoma  Cousin     History    Social History   .  Marital Status:  Married     Spouse Name:  N/A     Number of Children:  N/A   .  Years of Education:  N/A    Occupational History   .  RETIRED      Journalist, newspaper, Paramedic    Social History Main Topics   .  Smoking status:  Current Everyday Smoker -- 2.0 packs/day for 40 years     Types:  Cigarettes   .  Smokeless tobacco:  Not on file   .  Alcohol Use:  Yes      rare ETOH use   .  Drug Use:  No   .  Sexually Active:  Not on file    History   Smoking status   .  Former Smoker -- 2.0 packs/day for 40 years   .  Types:  Cigarettes   .  Quit date:  04/18/2011   Smokeless tobacco   .  Not on file    History   Alcohol Use   .  Yes     rare ETOH use    Allergies   Allergen  Reactions   .  Avelox (Moxifloxacin Hcl In Nacl)  Rash   .  Codeine  Rash    Current Outpatient Prescriptions   Medication  Sig  Dispense  Refill   .  aspirin 81 MG tablet  Take 81 mg by mouth 2 (two) times daily.     .  Aspirin-Acetaminophen-Caffeine (GOODY HEADACHE PO)  Take 1-2 packets by mouth 2 (two) times daily as needed. As needed for back pain & headaches.     [name]  BENICAR HCT 40-12.5 MG per tablet  Once daily     .  budesonide-formoterol (SYMBICORT) 160-4.5 MCG/ACT inhaler  Inhale 2 puffs into the lungs 2 (two) times daily.     .  CRESTOR 10 MG tablet  Take 10 mg by mouth daily. Once daily     .  Multiple Vitamin (MULITIVITAMIN WITH MINERALS) TABS  Take 1 tablet by mouth daily.     [name]  omeprazole (PRILOSEC) 20 MG capsule  Take 20 mg by mouth daily. Once daily     .  VENTOLIN HFA 108 (90 BASE) MCG/ACT inhaler  Inhale 2 puffs into the lungs every 6 (six) hours as needed. For shortness of  breath     Review of Systems: At time of consultation Cardiac Review of Systems: Y or N  Chest Pain [ y rt shoulder ] Resting SOB [ n ] Exertional SOB Cove.Etienne ] Orthopnea [ y ]  Pedal Edema [ n ] Palpitations [n ] Syncope [ n ] Presyncope [ n ]  General Review of Systems: [Y] = yes [ ] =no  Constitional: recent weight change [ y ]; anorexia [ n ]; fatigue Cove.Etienne ]; nausea [ n ]; night sweats [n ]; fever [ n ]; or chills [ n ];  Dental: poor dentition[ ] ; Last Dentist visit:  Eye : blurred vision [ ] ; diplopia [ ] ; vision changes [ ] ; Amaurosis fugax[ ] ;  Resp: cough [ y ]; wheezing[ y ]; hemoptysis[ n ]; shortness of breath[ y ]; paroxysmal nocturnal dyspnea[ n]; dyspnea on exertion[y ]; or orthopnea[n ];  GI: gallstones[ ] , vomiting[ ] ; dysphagia[ ] ; melena[ ] ; hematochezia [ n]; heartburn[ ] ; Hx of Colonoscopy[ ] ;  GU: kidney stones [ ] ; hematuria[ ] ; dysuria [ ] ; nocturia[ ] ; history of obstruction [ ] ;  Skin: rash, swelling[ ] ;, hair loss[ ] ; peripheral edema[ ] ; or itching[ ] ;  Musculosketetal: myalgias[ ] ; joint swelling[ ] ; joint erythema[ ] ;  joint pain[ y ]; back pain[y ];  Heme/Lymph: bruising[ ] ; bleeding[ ] ; anemia[ ] ;  Neuro: TIA[ ] ; headaches[ ] ; stroke[ ] ; vertigo[ ] ; seizures[ ] ; paresthesias[ ] ; difficulty walking[ ] ;  Psych:depression[ ] ; anxiety[ ] ;  Endocrine: diabetes[ ] ; thyroid dysfunction[ ] ;  Immunizations: Flu [ n ]; Pneumococcal[ n];  Other:  Physical Exam: At time of consultation BP 123/82  Pulse 76  Resp 16  Ht 5\' 6"  (1.676 m)  Wt 132 lb (59.875 kg)  BMI 21.31 kg/m2  SpO2 97%  General appearance: alert,  cooperative, appears older than stated age and no distress  Neurologic: intact  Heart: regular rate and rhythm, S1, S2 normal, no murmur, click, rub or gallop  Lungs: diminished breath sounds bibasilar  Abdomen: soft, non-tender; bowel sounds normal; no masses, no organomegaly  Extremities: extremities normal, atraumatic, no cyanosis or edema and Homans sign is  negative, no sign of DVT  No cervical or supraclavicular adenopathy is appreciated  Diagnostic Studies & Laboratory data:  Recent Radiology Findings:  Rest Procedure: Myocardial perfusion imaging was performed at rest 45 minutes following the intravenous administration of Technetium 62m Tetrofosmin.  Rest ECG: SB  Stress Procedure: The patient exercised for 11:30 mins. The patient stopped due to leg fatigue/mild sob and denied any chest pain. There were no significant ST-T wave changes/occ PVCs/PACs. Technetium 26m Tetrofosmin was injected at peak exercise and myocardial perfusion imaging was performed after a brief delay.  Stress ECG: No significant change from baseline ECG  QPS  Raw Data Images: Patient motion noted; appropriate software correction applied.  Stress Images: Normal homogeneous uptake in all areas of the myocardium.  Rest Images: Normal homogeneous uptake in all areas of the myocardium.  Subtraction (SDS): No evidence of ischemia.  Transient Ischemic Dilatation (Normal <1.22): 1.09  Lung/Heart Ratio (Normal <0.45): 0.31  Quantitative Gated Spect Images  QGS EDV: 104 ml  QGS ESV: 43 ml  QGS cine images: Normal Wall Motion  QGS EF: 58%  Impression  Exercise Capacity: Good exercise capacity.  BP Response: Normal blood pressure response.  Clinical Symptoms: No chest pain.  ECG Impression: No significant ST segment change suggestive of ischemia.  Comparison with Prior Nuclear Study: No images to compare  Overall Impression: Normal stress nuclear study.  Willa Rough, MD  Mr Brain W Wo Contrast  04/24/2011 *RADIOLOGY REPORT* Clinical Data: Lung mass. Rule out metastatic disease. BUN and creatinine were obtained on site at [facility] at [address] Results: BUN 10 mg/dL, Creatinine 1.1 mg/dL. MRI HEAD WITHOUT AND WITH CONTRAST Technique: Multiplanar, multiecho pulse sequences of the brain and surrounding structures were obtained according to standard protocol  without and with intravenous contrast Contrast: 12mL MULTIHANCE GADOBENATE DIMEGLUMINE 529 MG/ML IV SOLN Comparison: CT head 07/01/2007 Findings: Ventricle size is normal. Negative for acute infarct. Scattered small nonenhancing hyperintensities in the subcortical white matter of the frontal lobes bilaterally, likely related to chronic microvascular ischemia. Brainstem and cerebellum are intact. Negative for hemorrhage or mass. No enhancing lesions are identified post contrast. Negative for metastatic disease. Mucosal thickening in the base of the maxillary antra bilaterally. IMPRESSION: Negative for metastatic disease. Mild chronic microvascular ischemic changes in the white matter. Original Report Authenticated By: Camelia Phenes, M.D.  Nm Pet Image Initial (pi) Skull Base To Thigh  04/10/2011 *RADIOLOGY REPORT* Clinical Data: Initial treatment strategy for abnormal chest CT, demonstrating a right upper lobe 2.5 cm nodule. NUCLEAR MEDICINE PET CT SKULL BASE TO THIGH Technique: 17.8 mCi F-18 FDG was injected intravenously via the right AC. Full-ring PET imaging was performed from the skull base through the mid-thighs 60 minutes after injection. CT data was obtained and used for attenuation correction and anatomic localization only. (This was not acquired as a diagnostic CT examination.) Fasting Blood Glucose: 94 Patient Weight: 128 pounds. Comparison: Chest CT of 04/01/2011 Findings: PET images demonstrate extensive muscular activity throughout the neck and upper chest. The right apical lung nodule primarily demonstrates low level, non malignant range hypermetabolism. For example, this measures a S.U.V. max of 1.8 on  image 61. Along its cephalad aspect, where it contacts the pleural surface, an area of more moderate hypermetabolism measures a S.U.V. max of 2.8, including on image 58. No abnormal activity within the nodal stations of the mediastinum or hilum. No abnormal activity within the abdomen or pelvis. CT  images performed for attenuation correction demonstrate right maxillary sinus mucous retention cyst or polyp. Advanced carotid atherosclerosis. Chest findings deferred to recent diagnostic chest CT. Coronary artery atherosclerosis, including likely within the left main coronary artery on image 99. No acute superimposed process identified. There is moderate to marked centrilobular emphysema. Lumbosacral spine fixation. IMPRESSION: 1. The right apical lung nodule demonstrates primarily low level nonmalignant range hypermetabolism. A portion of its peripheral/pleural-based component demonstrates borderline malignant range hypermetabolism. 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 Tissue sampling should be considered. 2. No evidence of hypermetabolic thoracic nodal or extrathoracic disease. Given the relative low level activity of the suspicious right apical nodule, PET may be of decreased sensitivity for nodal metastasis. 3. Coronary artery disease, including likely within the left main. Original Report Authenticated By: Consuello Bossier, M.D.  Nm Pet Image Initial (pi) Skull Base To Thigh  04/10/2011 *RADIOLOGY REPORT* Clinical Data: Initial treatment strategy for abnormal chest CT, demonstrating a right upper lobe 2.5 cm nodule. NUCLEAR MEDICINE PET CT SKULL BASE TO THIGH Technique: 17.8 mCi F-18 FDG was injected intravenously via the right AC. Full-ring PET imaging was performed from the skull base through the mid-thighs 60 minutes after injection. CT data was obtained and used for attenuation correction and anatomic localization only. (This was not acquired as a diagnostic CT examination.) Fasting Blood Glucose: 94 Patient Weight: 128 pounds. Comparison: Chest CT of 04/01/2011 Findings: PET images demonstrate extensive muscular activity throughout the neck and upper chest. The right apical lung nodule primarily  demonstrates low level, non malignant range hypermetabolism. For example, this measures a S.U.V. max of 1.8 on image 61. Along its cephalad aspect, where it contacts the pleural surface, an area of more moderate hypermetabolism measures a S.U.V. max of 2.8, including on image 58. No abnormal activity within the nodal stations of the mediastinum or hilum. No abnormal activity within the abdomen or pelvis. CT images performed for attenuation correction demonstrate right maxillary sinus mucous retention cyst or polyp. Advanced carotid atherosclerosis. Chest findings deferred to recent diagnostic chest CT. Coronary artery atherosclerosis, including likely within the left main coronary artery on image 99. No acute superimposed process identified. There is moderate to marked centrilobular emphysema. Lumbosacral spine fixation. IMPRESSION: 1. The right apical lung nodule demonstrates primarily low level nonmalignant range hypermetabolism. A portion of its peripheral/pleural-based component demonstrates borderline malignant range hypermetabolism. 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 Tissue sampling should be considered. 2. No evidence of hypermetabolic thoracic nodal or extrathoracic disease. Given the relative low level activity of the suspicious right apical nodule, PET may be of decreased sensitivity for nodal metastasis. 3. Coronary artery disease, including likely within the left main. Original Report Authenticated By: Consuello Bossier, M.D.   Dr. Sheliah Plane saw the patient in thoracic surgical consultation and he was admitted. On the 06/05/2011 he was taken to the operating room at which time he underwent the following procedure:  OPERATIVE REPORT  PREOPERATIVE DIAGNOSIS: New right upper lobe lung lesion, suspicious  for carcinoma of the  lung.  POSTOPERATIVE DIAGNOSES: New right upper lobe lung  lesion,  lesion found to granulomatous disease by frozen section.  PROCEDURE PERFORMED: Bronchoscopy, right video-assisted thoracoscopy,  mini thoracotomy, wedge resection of right upper lobe lung lesion,  placement of On-Q.  SURGEON: Sheliah Plane, MD  FIRST ASSISTANT: Doree Fudge, PA The patient tolerated the procedure well was taken to the post anesthesia care unit in stable condition.   Post operative Hospital Course:   The patient has overall done quite well. Initially the lesion showed necrotizing granulomatous findings and acid fast bacilli positive smear for microbiology. Due to these findings infectious disease consultation was obtained. He was placed in isolation for tuberculosis precautions. He was felt to require initiation of 4 drug therapy. He did have some difficulty with nausea related to the medications and dosing has been adjusted with improvement in his symptoms. Screens for HIV and hepatitis B. and C. were negative. His quantity Neurontin Hallelujah Wysong test is currently pending. His TB PCR was sent to the [facility] and we'll probably be back in one week. The health department has been contacted and arrangements are made for followup with them. All routine lines monitors and drainage devices have been discontinued in the standard fashion. Incision is healing well evidence of infection the he does have a small stable right-sided pneumothorax which is not causing any clinical issues. He is tolerating increasing activities using standard protocols. Overall his status is stable for discharge on today's date.   No results found for this basename: NA:2,K:2,CL:2,CO2:2,GLUCOSE:2,BUN:2,CRATININE:2,CALCIUM:2 in the last 72 hours No results found for this basename: WBC:2,HGB:2,HCT:2,PLT:2 in the last 72 hours No results found for this basename: INR:2 in the last 72 hours   Discharge Instructions:  The patient is discharged to home with extensive instructions on wound care and progressive  ambulation.  They are instructed not to drive or perform any heavy lifting until returning to see the physician in his office.  Discharge Diagnosis:  RUL MASS Possible tuberculosis Secondary Diagnosis: Patient Active Problem List  Diagnoses  . Chest mass  . COPD (chronic obstructive pulmonary disease)  . Tobacco abuse  . Coronary atherosclerosis of native coronary artery   Past Medical History  Diagnosis Date  . Hypertension   . Emphysema   . Hyperlipidemia   . Allergic rhinitis   . Coronary artery disease     sm blockages on 2007 cath  . COPD (chronic obstructive pulmonary disease)     emyphesema  also  . Shortness of breath     DOE  . Recurrent upper respiratory infection (URI)     NOV  & DECEMBER 2012  . GERD (gastroesophageal reflux disease)   . Headache   . HOH (hard of hearing)     BOTH.....Marland KitchenLEFT IS BETTER THEN RIGHT       Caleb [name], Caleb [name]  Home Medication Instructions ZOX:[id number]   Printed on:05/14/11 1417  Medication Information                    VENTOLIN HFA 108 (90 BASE) MCG/ACT inhaler Inhale 2 puffs into the lungs every 6 (six) hours as needed. For shortness of breath           BENICAR HCT 40-12.5 MG per tablet Take 1 tablet by mouth daily. Once daily           omeprazole (PRILOSEC) 20 MG capsule Take 20 mg by mouth daily. Once daily           CRESTOR 10 MG  tablet Take 10 mg by mouth daily. Once daily           budesonide-formoterol (SYMBICORT) 160-4.5 MCG/ACT inhaler Inhale 2 puffs into the lungs 2 (two) times daily.           aspirin 81 MG tablet Take 81 mg by mouth 2 (two) times daily.            Multiple Vitamin (MULITIVITAMIN WITH MINERALS) TABS Take 1 tablet by mouth daily.           ethambutol (MYAMBUTOL) 400 MG tablet Take 3 tablets (1,200 mg total) by mouth daily.           isoniazid (NYDRAZID) 300 MG tablet Take 1 tablet (300 mg total) by mouth daily.           nicotine (NICODERM CQ - DOSED IN MG/24 HOURS) 14 mg/24hr  patch Place 1 patch onto the skin daily.           pyrazinamide 500 MG tablet Take 3 tablets (1,500 mg total) by mouth daily.           pyridOXINE (B-6) 50 MG tablet Take 1 tablet (50 mg total) by mouth daily.           traMADol (ULTRAM) 50 MG tablet Take 1-2 tablets (50-100 mg total) by mouth every 6 (six) hours as needed.           budesonide-formoterol (SYMBICORT) 160-4.5 MCG/ACT inhaler Inhale 2 puffs into the lungs 2 (two) times daily.           rifampin (RIFADIN) 300 MG capsule Take 2 capsules (600 mg total) by mouth daily.             Disposition: For discharge home  Patient's condition is Good  Gershon Crane, PA-C 05/14/2011  2:17 PM

## 2011-05-14 NOTE — Progress Notes (Signed)
                    [address]            [place] [address]          [phone]     6 Days Post-Op Procedure(s) (LRB): VIDEO BRONCHOSCOPY (N/A) VIDEO ASSISTED THORACOSCOPY (VATS)/WEDGE RESECTION (Right)  Subjective: Nausea improved after adjustment of meds. Wants to go home.  Objective: Vital signs in last 24 hours: Patient Vitals for the past 24 hrs:  BP Temp Temp src Pulse Resp SpO2  05/14/11 0822 - - - - - 96 %  05/14/11 0448 123/76 mmHg 98.6 F (37 C) Oral - 17  96 %  05/14/11 0400 - - - - 17  -  05/14/11 0000 - - - - 20  -  05/13/11 2343 130/78 mmHg 99 F (37.2 C) Oral 66  19  96 %  05/13/11 2055 - - - - - 95 %  05/13/11 2000 - - - - 16  -  05/13/11 1951 142/80 mmHg 98.9 F (37.2 C) Oral - 19  -  05/13/11 1600 134/68 mmHg 98.2 F (36.8 C) Oral 65  - 94 %  05/13/11 1200 123/66 mmHg 98.2 F (36.8 C) Oral 74  - 95 %   Current Weight  05/11/11 60.8 kg (134 lb 0.6 oz)     Intake/Output from previous day: 02/11 0701 - 02/12 0700 In: 720 [P.O.:720] Out: 1500 [Urine:1500]    PHYSICAL EXAM:  Heart: RRR Lungs: clear Wound: clean and dry   Lab Results: CBC:No results found for this basename: WBC:2,HGB:2,HCT:2,PLT:2 in the last 72 hours BMET: No results found for this basename: NA:2,K:2,CL:2,CO2:2,GLUCOSE:2,BUN:2,CREATININE:2,CALCIUM:2 in the last 72 hours  PT/INR: No results found for this basename: LABPROT,INR in the last 72 hours  CXR: stable R apical ptx  Assessment/Plan: S/P Procedure(s) (LRB): VIDEO BRONCHOSCOPY (N/A) VIDEO ASSISTED THORACOSCOPY (VATS)/WEDGE RESECTION (Right) Doing well from surgery. ID- stable on multi-drug Tb regimen. Possibly home later today vs in am.   LOS: 6 days    Eltha Tingley H 05/14/2011

## 2011-05-20 ENCOUNTER — Ambulatory Visit: Payer: 59 | Admitting: Emergency Medicine

## 2011-05-20 ENCOUNTER — Ambulatory Visit (INDEPENDENT_AMBULATORY_CARE_PROVIDER_SITE_OTHER): Payer: 59

## 2011-05-20 VITALS — BP 120/81 | HR 92 | Temp 98.2°F | Resp 20

## 2011-05-20 DIAGNOSIS — Z9889 Other specified postprocedural states: Secondary | ICD-10-CM

## 2011-05-20 DIAGNOSIS — Z4802 Encounter for removal of sutures: Secondary | ICD-10-CM

## 2011-05-20 DIAGNOSIS — D381 Neoplasm of uncertain behavior of trachea, bronchus and lung: Secondary | ICD-10-CM

## 2011-05-20 DIAGNOSIS — R918 Other nonspecific abnormal finding of lung field: Secondary | ICD-10-CM

## 2011-05-20 NOTE — Progress Notes (Signed)
Removed 1 suture from chest tube site. No signs of infection and pt tolerated well. 

## 2011-05-20 NOTE — Patient Instructions (Signed)
Post op appt with Dr Tyrone Sage on 06/06/11 @ 1430 with CXR prior

## 2011-05-23 ENCOUNTER — Telehealth: Payer: Self-pay | Admitting: *Deleted

## 2011-05-23 NOTE — Telephone Encounter (Signed)
Caleb [name] from [facility] called [phone]) asking for orders from Dr. Ninetta Lights. This pt had been seen by him in the hospital & given rx for his meds. He has been on daily DOT. It is time to change to twice a week. She needs that order. Per Tomasita Morrow, RN I gave her the pager for Dr. Ninetta Lights

## 2011-06-03 ENCOUNTER — Other Ambulatory Visit: Payer: Self-pay | Admitting: Cardiothoracic Surgery

## 2011-06-03 ENCOUNTER — Ambulatory Visit (INDEPENDENT_AMBULATORY_CARE_PROVIDER_SITE_OTHER): Payer: Self-pay | Admitting: Cardiothoracic Surgery

## 2011-06-03 ENCOUNTER — Ambulatory Visit
Admission: RE | Admit: 2011-06-03 | Discharge: 2011-06-03 | Disposition: A | Discharge status: Home / Self Care | Payer: 59 | Source: Ambulatory Visit | Attending: Cardiothoracic Surgery | Admitting: Cardiothoracic Surgery

## 2011-06-03 ENCOUNTER — Encounter: Payer: Self-pay | Admitting: Cardiothoracic Surgery

## 2011-06-03 VITALS — BP 117/75 | HR 70 | Resp 18 | Ht 66.0 in | Wt 134.0 lb

## 2011-06-03 DIAGNOSIS — R222 Localized swelling, mass and lump, trunk: Secondary | ICD-10-CM

## 2011-06-03 DIAGNOSIS — D381 Neoplasm of uncertain behavior of trachea, bronchus and lung: Secondary | ICD-10-CM

## 2011-06-03 DIAGNOSIS — R918 Other nonspecific abnormal finding of lung field: Secondary | ICD-10-CM

## 2011-06-03 DIAGNOSIS — Z9889 Other specified postprocedural states: Secondary | ICD-10-CM

## 2011-06-03 NOTE — Progress Notes (Signed)
[address]            [place] [address]          [phone]       LORIMER TIBERIO [facility] Medical Record #[id number] Date of Birth: 1950-07-09  Marylen Ponto, MD Marylen Ponto, MD, MD  Chief Complaint:   PostOp Follow Up Visit PREOPERATIVE DIAGNOSIS: New right upper lobe lung lesion, suspicious  for carcinoma of the lung.  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  PROCEDURE PERFORMED: Bronchoscopy, right video-assisted thoracoscopy,  mini thoracotomy, wedge resection of right upper lobe lung lesion,  placement of On-Q. 05/08/2011  History of Present Illness:     Patient is doing well following his resection of right upper lobe lung nodule which on final path was a granuloma, 1 AFB smear was positive and the patient was started on tuberculosis medications. Last week he developed severe rash was seen in his primary care office and in the emergency room and according to the patient in consultation with the public health Department stopped the tuberculosis medications, presumably the cause of the rash. Postop he has had no fever chills no cough, he has not returned to smoking, and is walking several miles a day without difficulty.    History  Smoking status  . Former Smoker -- 1.0 packs/day for 40 years  . Types: Cigarettes  . Quit date: 04/18/2011  Smokeless tobacco  . Not on file       Allergies  Allergen Reactions  . Omnipaque (Iohexol)   . Avelox (Moxifloxacin Hcl In Nacl) Rash  . Codeine Rash    Current Outpatient Prescriptions  Medication Sig Dispense Refill  . aspirin 81 MG tablet Take 81 mg by mouth 2 (two) times daily.       [name] BENICAR HCT 40-12.5 MG per tablet Take 1 tablet by mouth daily. Once daily      . budesonide-formoterol (SYMBICORT) 160-4.5 MCG/ACT inhaler Inhale 2 puffs into the lungs 2 (two) times daily.  1 Inhaler  1  . CRESTOR 10  MG tablet Take 10 mg by mouth daily. Once daily      . Multiple Vitamin (MULITIVITAMIN WITH MINERALS) TABS Take 1 tablet by mouth daily.      . VENTOLIN HFA 108 (90 BASE) MCG/ACT inhaler Inhale 2 puffs into the lungs every 6 (six) hours as needed. For shortness of breath           Physical Exam: BP 117/75  Pulse 70  Resp 18  Ht 5\' 6"  (1.676 m)  Wt 134 lb (60.782 kg)  BMI 21.63 kg/m2  SpO2 98%  General appearance: alert, cooperative and no distress Neurologic: intact Heart: regular rate and rhythm, S1, S2 normal, no murmur, click, rub or gallop Lungs: clear to auscultation bilaterally Abdomen: soft, non-tender; bowel sounds normal; no masses,  no organomegaly Extremities: extremities normal, atraumatic, no cyanosis or edema and Homans sign is negative, no sign of DVT Wounds:right chest incision sites well healed  Diagnostic Studies & Laboratory data:         Recent Radiology Findings:  Dg Chest 2 View  06/03/2011  *RADIOLOGY REPORT*  Clinical Data: Cough, right lung surgery  CHEST - 2 VIEW  Comparison: 05/14/2011  Findings: Postsurgical changes in the right upper lobe.  No pneumothorax is seen.  Prior  subcutaneous emphysema along the right lateral chest wall has resolved.  Left lung is essentially clear.  No pleural effusion.  Cardiomediastinal silhouette is within normal limits. Mild degenerative changes of the visualized thoracolumbar spine.  IMPRESSION: Postsurgical changes in the right upper lobe.  No evidence of acute cardiopulmonary disease.  Original Report Authenticated By: Charline Bills, M.D.      Recent Labs: Lab Results  Component Value Date   WBC 9.2 05/10/2011   HGB 11.3* 05/10/2011   HCT 33.2* 05/10/2011   PLT 234 05/10/2011   GLUCOSE 108* 05/10/2011   ALT 18 05/10/2011   AST 33 05/10/2011   NA 139 05/10/2011   K 3.7 05/10/2011   CL 104 05/10/2011   CREATININE 0.81 05/10/2011   BUN 10 05/10/2011   CO2 28 05/10/2011   INR 0.94 05/06/2011      Assessment / Plan:      Patient  doing well following wedge resection of right upper lobe lesion, no malignancy was found. Smears did indicate presence of AFB subsequent tests have not confirmed this. The patient was seen by Dr. Ninetta Lights during this hospitalization who recommended tuberculosis treatment. 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 I plan to see him back with a followup chest x-ray in 3 months He has an appointment for followup in ID clinic concerning the question of the diagnosis of tuberculosis.   Delight Ovens MD 06/03/2011 4:58 PM

## 2011-06-03 NOTE — Patient Instructions (Signed)
See me in 3 months with chest xray

## 2011-06-04 ENCOUNTER — Telehealth: Payer: Self-pay | Admitting: *Deleted

## 2011-06-04 NOTE — Telephone Encounter (Signed)
Caleb [name] from [facility] Department called regarding orders for this patient.  She got medication orders from Dr. Ninetta Lights when he was discharged from the hospital.  Patient has not been seen here yet, gave nurse Dr. Moshe Cipro page number. Wendall Mola CMA

## 2011-06-06 ENCOUNTER — Ambulatory Visit: Payer: 59 | Admitting: Cardiothoracic Surgery

## 2011-06-07 LAB — FUNGUS CULTURE W SMEAR: Fungal Smear: NONE SEEN

## 2011-06-13 ENCOUNTER — Telehealth: Payer: Self-pay | Admitting: *Deleted

## 2011-06-13 NOTE — Telephone Encounter (Signed)
Call from [facility] lab, there has been a delay on the AFB culture, slow growing, results are not finalized yet.  Patient has appointment 06/26/11. Wendall Mola CMA

## 2011-06-26 ENCOUNTER — Encounter: Payer: Self-pay | Admitting: Infectious Diseases

## 2011-06-26 ENCOUNTER — Ambulatory Visit (INDEPENDENT_AMBULATORY_CARE_PROVIDER_SITE_OTHER): Payer: 59 | Admitting: Infectious Diseases

## 2011-06-26 VITALS — BP 147/82 | HR 71 | Temp 98.6°F | Ht 66.0 in | Wt 136.4 lb

## 2011-06-26 DIAGNOSIS — R222 Localized swelling, mass and lump, trunk: Secondary | ICD-10-CM

## 2011-06-26 NOTE — Assessment & Plan Note (Signed)
Clinically he seems to be doing well. I spoke with the lab this AM- his cultures are still pending. Thjey have a small amount of growth which enables [place] to do very minimal testing. Would consider treating him empirically however he has had adverse drug reaction to his TB medications. Many of these meds are included in regimens for non-tuberculous mycobacteria as well. I explained to him that there are still over 50 different kinds of mycobacteria that this could possibly be despite his negative tests for MAI and M gordonae. Will see him back in 6 weeks, hopefully will have more useful data at that point.

## 2011-06-26 NOTE — Progress Notes (Signed)
  Subjective:    Patient ID: Caleb [name], male    DOB: 12-24-1950, 61 y.o.   MRN: [id number]  HPI 61 yo M with hx of HTN, COPD, hyperlipidemia. In November 2012 had URI. Took course of anbx and this improved but then recurred in December. He was seen and asked for CXR as he was having chest pain in his R upper chest. This was found to show a mass. He underwent a CT scan  And then a PET scan (04-10-11):The right apical lung nodule demonstrates primarily low level nonmalignant range hypermetabolism. A portion of its peripheral/pleural-based component demonstrates borderline malignant range hypermetabolism. 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  He was adm to the hospital 05-08-11 and underwent VATS. His VATS yesterday showed necrotizing granuloma and AFB positive smear from microbiology. His serum quantiferon gold was (-). A PCR done on his VATS specimen was (-) for TB as well. He was started on 4 drug TB rx and sent home 05-14-11. He followed up with his health dept and continued on 4 drug rx until 05-23-11. He developed a rash at that time. His rash improved with benadryl initially. He then went to ED on 2-22 and was given steroid injection.  His rash has since resolved.  His AFB Cx has had minimal growth. Has had this probed to see if this is M avium (-), gordonae (-) and TB (-).   ROS- No fevers, chills; has occas cough which he attributes to "clearing up his smokers cough". Non-prod, no hemoptysis. Has pain supra-gastric pain with cough, extension.  Feels like burning.   Has been exercising, quit smoking, wt has "come up a little bit". No lymphadenopathy. Has extensive hx of exposure to cleaning solutions. His only foreign travel was [place] 10 years ago. Has been having cramps in his feet over the last week.    Review of Systems     Objective:   Physical Exam  Constitutional: He appears  well-developed and well-nourished.  HENT:  Mouth/Throat: No oropharyngeal exudate.  Eyes: EOM are normal. Pupils are equal, round, and reactive to light.  Neck: Neck supple.  Cardiovascular: Normal rate, regular rhythm and normal heart sounds.   Pulmonary/Chest: Effort normal and breath sounds normal.  Abdominal: Soft. Bowel sounds are normal. He exhibits no distension.  Lymphadenopathy:    He has no cervical adenopathy.    He has no axillary adenopathy.          Assessment & Plan:

## 2011-06-28 LAB — AFB CULTURE WITH SMEAR (NOT AT ARMC)

## 2011-07-01 ENCOUNTER — Telehealth: Payer: Self-pay | Admitting: *Deleted

## 2011-07-01 LAB — AFB CULTURE WITH SMEAR (NOT AT ARMC)

## 2011-07-01 NOTE — Telephone Encounter (Signed)
Ms Caleb [name] form [employer] called and advised that the AFB test ordered on this patient is not complete because it would not grow. She asked if we wanted to submit another sample as they can not work with that sample any more. Advised her will forward a note to the provider and get back to her at [phone] as soon as I hear something.

## 2011-07-23 ENCOUNTER — Encounter: Payer: Self-pay | Admitting: Adult Health

## 2011-07-23 ENCOUNTER — Ambulatory Visit (INDEPENDENT_AMBULATORY_CARE_PROVIDER_SITE_OTHER): Payer: 59 | Admitting: Adult Health

## 2011-07-23 VITALS — BP 114/68 | HR 77 | Temp 98.2°F | Ht 66.0 in | Wt 136.4 lb

## 2011-07-23 DIAGNOSIS — J449 Chronic obstructive pulmonary disease, unspecified: Secondary | ICD-10-CM

## 2011-07-23 MED ORDER — HYDROCODONE-HOMATROPINE 5-1.5 MG/5ML PO SYRP: 5.0000 mL | ORAL_SOLUTION | Freq: Four times a day (QID) | ORAL | Status: AC | PRN

## 2011-07-23 MED ORDER — PREDNISONE 10 MG PO TABS: ORAL_TABLET | ORAL | Status: DC

## 2011-07-23 MED ORDER — LEVALBUTEROL HCL 0.63 MG/3ML IN NEBU
0.6300 mg | INHALATION_SOLUTION | Freq: Once | RESPIRATORY_TRACT | Status: AC
Start: 2011-07-23 — End: 2011-07-23
  Administered 2011-07-23: 0.63 mg via RESPIRATORY_TRACT

## 2011-07-23 NOTE — Progress Notes (Signed)
Subjective:    Patient ID: Caleb [name], male    DOB: 1950-12-11, 61 y.o.   MRN: [id number]  HPI 61 yo man, smoker (80 pk-yrs), hx of COPD on Symbicort + prn SABA. Also hx allergies and HTN.  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 TB  Spirometry 04/17/11>FEV1 43%, ratio 50    04/17/11 New Consult  Referred for CT scan chest with RUL mass in 04/01/2011. PET scan 04/10/11.  PET scan (04-10-11):The right apical lung nodule demonstrates primarily low level nonmalignant range hypermetabolism. A portion of its peripheral/pleural-based component demonstrates borderline malignant range hypermetabolism. He is feeling well, not coughing. The scan was prompted by URI and bronchitis in November and December. Has lot about 20 lbs over 3 months.  >>ENB (Mass resected, granuloma with question of TB, AFB neg )  07/23/2011 Acute OV  Complains of prod cough with yellow/brown mucus, increased SOB, chills/sweats x4days . Went to UC on 4.20.13 and was given septra ds. Patient presents today complaining that his cough is not any better, and he's having trouble sleeping at night. He complains of thick, yellow-brown mucus, and wheezing. He denies any hemoptysis, weight loss, chest pain, orthopnea, PND, or leg swelling. Is not taking any over-the-counter medications. He says that he was seen at urgent care with a chest x-ray done. Those results and records are not available at today's visit.  Pt was seen in January for lung mass consultation . He was adm to the hospital 05-08-11 and underwent VATS. Path showed a  necrotizing granuloma and AFB positive smear from microbiology.   A PCR done on his VATS specimen was (-) for TB as well. He was started on 4 drug TB rx per ID . He was not able to tolerate these meds with multiple side effects with ER and UC visits due to this.  He has been followed by ID , and is currently awaiting final results from reculture of AFB    Review of Systems Constitutional:   No  weight  loss, night sweats,  Fevers, chills, + fatigue, or  lassitude.  HEENT:   No headaches,  Difficulty swallowing,  Tooth/dental problems, or  Sore throat,                No sneezing, itching, ear ache, + nasal congestion, post nasal drip,   CV:  No chest pain,  Orthopnea, PND, swelling in lower extremities, anasarca, dizziness, palpitations, syncope.   GI  No heartburn, indigestion, abdominal pain, nausea, vomiting, diarrhea, change in bowel habits, loss of appetite, bloody stools.   Resp:   No coughing up of blood.  No chest wall deformity  Skin: no rash or lesions.  GU: no dysuria, change in color of urine, no urgency or frequency.  No flank pain, no hematuria   MS:  No joint pain or swelling.  No decreased range of motion.  No back pain.  Psych:  No change in mood or affect. No depression or anxiety.  No memory loss.          Objective:   Physical Exam GEN: A/Ox3; pleasant , NAD, well nourished   HEENT:  [place]/AT,  EACs-clear, TMs-wnl, NOSE-clear, THROAT-clear, no lesions, no postnasal drip or exudate noted.   NECK:  Supple w/ fair ROM; no JVD; normal carotid impulses w/o bruits; no thyromegaly or nodules palpated; no lymphadenopathy.  RESP  Coarse BS w/ faint exp wheeze .no accessory muscle use, no dullness to percussion  CARD:  RRR, no  m/r/g  , no peripheral edema, pulses intact, no cyanosis or clubbing.  GI:   Soft & nt; nml bowel sounds; no organomegaly or masses detected.  Musco: Warm bil, no deformities or joint swelling noted.   Neuro: alert, no focal deficits noted.    Skin: Warm, no lesions or rashes         Assessment & Plan:

## 2011-07-23 NOTE — Patient Instructions (Signed)
Finish Septra DS  Begin Prednisone taper as directed.  Mucinex DM Twice daily  As needed  Cough/congestion  Hydromet 1-2 tsp every 4-6 hrs As needed  For cough- may make you sleepy Fluids and rest  follow up Dr. Delton Coombes  In 3-4 weeks and As needed

## 2011-07-23 NOTE — Progress Notes (Signed)
Addended by: Boone Master E on: 07/23/2011 03:00 PM   Modules accepted: Orders

## 2011-07-23 NOTE — Assessment & Plan Note (Signed)
Exacerbation -slow to resolve   Plan:  Finish Septra DS  Begin Prednisone taper as directed.  Mucinex DM Twice daily  As needed  Cough/congestion  Hydromet 1-2 tsp every 4-6 hrs As needed  For cough- may make you sleepy Fluids and rest  follow up Dr. Delton Coombes  In 3-4 weeks and As needed

## 2011-08-07 ENCOUNTER — Ambulatory Visit (INDEPENDENT_AMBULATORY_CARE_PROVIDER_SITE_OTHER): Payer: 59 | Admitting: Infectious Diseases

## 2011-08-07 ENCOUNTER — Telehealth: Payer: Self-pay | Admitting: *Deleted

## 2011-08-07 ENCOUNTER — Encounter: Payer: Self-pay | Admitting: Infectious Diseases

## 2011-08-07 VITALS — BP 130/81 | HR 74 | Temp 98.2°F | Ht 66.0 in | Wt 136.1 lb

## 2011-08-07 DIAGNOSIS — R222 Localized swelling, mass and lump, trunk: Secondary | ICD-10-CM

## 2011-08-07 NOTE — Telephone Encounter (Signed)
I called him back to tell him about his appts. He needs to come here on 10/31/11 for labs to get a current BUN & Creatinine. He will pick up an "allergy Prep" from his drugstore on the 7th. This consists of meds that will hopefully blunt or prevent any reax he may have to the contrast media. It needs to be taken 13 hours before the CT. The actual appt is at [facility] on 11/07/11 at 10:15. hje wrote this down & I told him to call if he has questions. The scheduler at Radiology was Sheralyn Boatman, who was quite helpful

## 2011-08-07 NOTE — Assessment & Plan Note (Addendum)
Will see him back in August 2013 for a repeat Ct of his chest. He has a listed contrast allergy so will discuss with radiology (pt is unclear what he is allergic to and what the reaction was). Will discuss his case with [employer] (Gwynn Huitt), Zackery Barefoot and Ward Roxan Hockey to see if there are further tests that would be reasonable. Will see pt back in August 2013 , asked to call if he feels worse in the intervening period and we will see him sooner.

## 2011-08-07 NOTE — Progress Notes (Signed)
  Subjective:    Patient ID: Caleb [name], male    DOB: 10/23/1950, 61 y.o.   MRN: [id number]  HPI 61 yo M with hx of HTN, COPD, hyperlipidemia. States he had CXR (-) July 2011. In November 2012 had URI. Took course of anbx and this improved but then recurred in December. He was seen and asked for CXR as he was having chest pain in his R upper chest. This was found to show a mass. He underwent a CT scan And then a PET scan (04-10-11):The right apical lung nodule demonstrates primarily low level nonmalignant range hypermetabolism. A portion of its peripheral/pleural-based component demonstrates borderline malignant range hypermetabolism. 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 His serum quantiferon gold was (-). A PCR done on his VATS specimen was (-) for TB as well. He was started on 4 drug TB rx and sent home 05-14-11. He followed up with his health dept and continued on 4 drug rx until 05-23-11. He developed a rash at that time. His rash improved with benadryl initially. He then went to ED on 2-22 and was given steroid injection and rash resolved.  His AFB Cx has had minimal growth. Has had this probed to see if this is M avium (-), gordonae (-) and TB (-).  His only foreign travel was [place] 10 years ago.   Had URI 3-4 weeks ago, felt it was due to pollen. He took course of bactrim and prednisone and improved.  Has had flank pain from previous surgery from recent coughing. Has been sob with exertion- at his baseline. Has gained 9#. Occas sputum production- yellowish.    Review of Systems  Constitutional: Negative for appetite change and unexpected weight change.  Respiratory: Positive for cough and shortness of breath.        R pleuritic pain increased with deep breathing/coughing.   States constant now.   Gastrointestinal: Negative for nausea and diarrhea.  Genitourinary: Negative for dysuria.  Hematological: Negative for adenopathy.       Objective:   Physical Exam  Constitutional: He appears well-developed and well-nourished.  HENT:  Mouth/Throat: No oropharyngeal exudate.  Eyes: EOM are normal. Pupils are equal, round, and reactive to light.  Neck: Neck supple.  Cardiovascular: Normal rate, regular rhythm and normal heart sounds.   Pulmonary/Chest: Effort normal. He has decreased breath sounds.  Abdominal: Soft. Bowel sounds are normal. He exhibits no distension.  Lymphadenopathy:    He has no cervical adenopathy.          Assessment & Plan:

## 2011-08-15 ENCOUNTER — Telehealth: Payer: Self-pay | Admitting: Licensed Clinical Social Worker

## 2011-08-15 NOTE — Telephone Encounter (Signed)
Marlene TB from [place] called stating that she needed something stating that this patient does not need to be treated with TB medications. I sent Dr. Moshe Cipro note to her but it did not clearly state that he does or does not need to be treated. Please advise.

## 2011-08-21 ENCOUNTER — Other Ambulatory Visit: Payer: Self-pay | Admitting: Infectious Diseases

## 2011-08-21 ENCOUNTER — Telehealth: Payer: Self-pay | Admitting: Infectious Diseases

## 2011-08-21 NOTE — Telephone Encounter (Signed)
See note

## 2011-08-22 ENCOUNTER — Ambulatory Visit (INDEPENDENT_AMBULATORY_CARE_PROVIDER_SITE_OTHER): Payer: 59 | Admitting: Emergency Medicine

## 2011-08-22 ENCOUNTER — Encounter: Payer: Self-pay | Admitting: Emergency Medicine

## 2011-08-22 VITALS — BP 120/76 | HR 67 | Temp 98.0°F | Ht 66.0 in | Wt 141.8 lb

## 2011-08-22 DIAGNOSIS — J449 Chronic obstructive pulmonary disease, unspecified: Secondary | ICD-10-CM

## 2011-08-22 DIAGNOSIS — F17200 Nicotine dependence, unspecified, uncomplicated: Secondary | ICD-10-CM

## 2011-08-22 DIAGNOSIS — Z720 Tobacco use: Secondary | ICD-10-CM

## 2011-08-22 DIAGNOSIS — J4489 Other specified chronic obstructive pulmonary disease: Secondary | ICD-10-CM

## 2011-08-22 DIAGNOSIS — A319 Mycobacterial infection, unspecified: Secondary | ICD-10-CM

## 2011-08-22 MED ORDER — ALBUTEROL SULFATE (2.5 MG/3ML) 0.083% IN NEBU: 2.5000 mg | INHALATION_SOLUTION | Freq: Four times a day (QID) | RESPIRATORY_TRACT | Status: DC

## 2011-08-22 MED ORDER — IPRATROPIUM BROMIDE 0.02 % IN SOLN: 500.0000 ug | Freq: Four times a day (QID) | RESPIRATORY_TRACT | Status: DC

## 2011-08-22 NOTE — Assessment & Plan Note (Signed)
Would like to ramp up his BD regimen, but cost has been a problem, can't afford the Symbicort - will look into getting the most affordable BD regimen possible. Don';t believe we can add Spiriva at this time - walking oximetry today

## 2011-08-22 NOTE — Progress Notes (Signed)
Subjective:    Patient ID: Caleb [name], male    DOB: [birth year], 61 y.o.   MRN: [id number] HPI 61 yo man, smoker (80 pk-yrs), hx of COPD on Symbicort + prn SABA. Also hx allergies and HTN.  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 TB  Spirometry 04/17/11>FEV1 43%, ratio 50   04/17/11 New Consult  Referred for CT scan chest with RUL mass in 04/01/2011. PET scan 04/10/11.  PET scan (04-10-11):The right apical lung nodule demonstrates primarily low level nonmalignant range hypermetabolism. A portion of its peripheral/pleural-based component demonstrates borderline malignant range hypermetabolism. He is feeling well, not coughing. The scan was prompted by URI and bronchitis in November and December. Has lot about 20 lbs over 3 months.  >>ENB (Mass resected, granuloma with question of TB, AFB neg )  07/23/2011 Acute OV  Complains of prod cough with yellow/brown mucus, increased SOB, chills/sweats x4days . Went to UC on 4.20.13 and was given septra ds. Patient presents today complaining that his cough is not any better, and he's having trouble sleeping at night. He complains of thick, yellow-brown mucus, and wheezing. He denies any hemoptysis, weight loss, chest pain, orthopnea, PND, or leg swelling. Is not taking any over-the-counter medications. He says that he was seen at urgent care with a chest x-ray done. Those results and records are not available at today's visit.  Pt was seen in January for lung mass consultation . He was adm to the hospital 05-08-11 and underwent VATS. Path showed a  necrotizing granuloma and AFB positive smear from microbiology.   A PCR done on his VATS specimen was (-) for TB as well. He was started on 4 drug TB rx per ID . He was not able to tolerate these meds with multiple side effects with ER and UC visits due to this.  He has been followed by ID , and is currently awaiting final results from reculture of AFB   ROV 08/22/11 -- 61 yo man, tobacco quit 1/13. 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 He had a short course 4 drug therapy, all of his eval has been PCR negative for TB so far. He has a repeat CT scan ordered for August. He is taking Symbicort but only 1 puff bid, has SABA that he uses rarely. Was treated for AE 4/23 with pred. He is not having fevers, he does have night sweats but not every night. He coughs when he lays flat, better with his head up. He is on omeprazole. He has significant fatigue.  Has SOB with activity.       Objective:   Filed Vitals:   08/22/11 1445  BP: 120/76  Pulse: 67  Temp: 98 F (36.7 C)   Physical Exam GEN: A/Ox3; pleasant , NAD, well nourished   HEENT:  Caleb [name]/AT,  EACs-clear, TMs-wnl, NOSE-clear, THROAT-clear, no lesions, no postnasal drip or exudate noted.   NECK:  Supple w/ fair ROM; no JVD; normal carotid impulses w/o bruits; no thyromegaly or nodules palpated; no lymphadenopathy.  RESP  Coarse BS w/ faint exp wheeze .no accessory muscle use, no dullness to percussion  CARD:  RRR, no m/r/g  , no peripheral edema, pulses intact, no cyanosis or clubbing.  GI:   Soft & nt; nml bowel sounds; no organomegaly or masses detected.  Musco: Warm bil, no deformities or joint swelling noted.   Neuro: alert, no focal deficits noted.    Skin: Warm, no lesions  or rashes     Assessment & Plan:  Tobacco abuse Quit in january  COPD (chronic obstructive pulmonary disease) Would like to ramp up his BD regimen, but cost has been a problem, can't afford the Symbicort - will look into getting the most affordable BD regimen possible. 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 TB possible, but would expect TB PCR testing to have been positive. Unfortunately couldn't do cx on the sample, so we may never dx it if it was an atypical mycobacterial bug - agree with  CT scan in August w Dr Caleb [name]. They are following to decide whether other testing would be appropriate.,

## 2011-08-22 NOTE — Assessment & Plan Note (Signed)
Quit in january

## 2011-08-22 NOTE — Assessment & Plan Note (Signed)
AFB on the micro of his lung resection, but no real dx obtained. TB possible, but would expect TB PCR testing to have been positive. Unfortunately couldn't do cx on the sample, so we may never dx it if it was an atypical mycobacterial bug - agree with CT scan in August w Dr Ninetta Lights. They are following to decide whether other testing would be appropriate.,

## 2011-08-22 NOTE — Patient Instructions (Signed)
We will change your inhaled medication to nebulized albuterol and atrovent 4 times a day Walking oximetry today shows that you do not need to wear oxygen when you exercise Follow with Dr Delton Coombes in 3 months

## 2011-08-28 ENCOUNTER — Telehealth: Payer: Self-pay | Admitting: *Deleted

## 2011-08-28 NOTE — Telephone Encounter (Signed)
Dr. Ninetta Lights requested patient come in for follow up.  Given appointment for 09/04/11 at 9:00 AM. Wendall Mola CMA

## 2011-09-04 ENCOUNTER — Telehealth: Payer: Self-pay | Admitting: *Deleted

## 2011-09-04 ENCOUNTER — Encounter: Payer: Self-pay | Admitting: Infectious Diseases

## 2011-09-04 ENCOUNTER — Other Ambulatory Visit: Payer: Self-pay | Admitting: *Deleted

## 2011-09-04 ENCOUNTER — Ambulatory Visit (INDEPENDENT_AMBULATORY_CARE_PROVIDER_SITE_OTHER): Payer: 59 | Admitting: Infectious Diseases

## 2011-09-04 VITALS — BP 155/91 | HR 73 | Temp 97.7°F | Ht 66.0 in | Wt 139.0 lb

## 2011-09-04 DIAGNOSIS — R222 Localized swelling, mass and lump, trunk: Secondary | ICD-10-CM

## 2011-09-04 DIAGNOSIS — A310 Pulmonary mycobacterial infection: Secondary | ICD-10-CM

## 2011-09-04 MED ORDER — AZITHROMYCIN 500 MG PO TABS: 500.0000 mg | ORAL_TABLET | ORAL | Status: DC

## 2011-09-04 MED ORDER — RIFAMPIN 300 MG PO CAPS: 600.0000 mg | ORAL_CAPSULE | Freq: Every day | ORAL | Status: DC

## 2011-09-04 MED ORDER — ETHAMBUTOL HCL 100 MG PO TABS: 200.0000 mg | ORAL_TABLET | Freq: Every day | ORAL | Status: DC

## 2011-09-04 MED ORDER — AZITHROMYCIN 250 MG PO TABS: ORAL_TABLET | ORAL | Status: DC

## 2011-09-04 MED ORDER — ETHAMBUTOL HCL 400 MG PO TABS: 800.0000 mg | ORAL_TABLET | Freq: Every day | ORAL | Status: DC

## 2011-09-04 NOTE — Telephone Encounter (Signed)
Spoke with Dr. Ninetta Lights and okd for patient to start the medication for MAC.  Sent to [facility] in [place].  Patient aware, and he will call if he has trouble affording these meds and arrange to speak with Pam, medication assistance specialist. Wendall Mola CMA

## 2011-09-04 NOTE — Assessment & Plan Note (Signed)
His course is complicated by a hx of rash/hives due to the 4 drug therapy he got previously. It is not clear which of these medications caused this.  Would give him a trial of the 3 drugs, he is hesitant to start this due to previous ADR. He is also concerned about the cost of the medication. We will continue with the previous plan to rescan him in August and re-asses after he has CT. He has date (11-07-11) for this already and rx for prophylactic for dye rxn.

## 2011-09-04 NOTE — Progress Notes (Signed)
  Subjective:    Patient ID: Caleb [name], male    DOB: 18-May-1950, 61 y.o.   MRN: [id number]  HPI 61 yo M with hx of HTN, COPD, hyperlipidemia. States he had CXR (-) July 2011. In November 2012 had URI. Took course of anbx and this improved but then recurred in December. He was seen and asked for CXR as he was having chest pain in his R upper chest. This was found to show a mass. He underwent a CT scan And then a PET scan (04-10-11):The right apical lung nodule demonstrates primarily low level nonmalignant range hypermetabolism. A portion of its peripheral/pleural-based component demonstrates borderline malignant range hypermetabolism. 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 His serum quantiferon gold was (-). A PCR done on his VATS specimen was (-) for TB as well. He was started on 4 drug TB rx and sent home 05-14-11. He followed up with his health dept and continued on 4 drug rx until 05-23-11. He developed a rash at that time. His rash improved with benadryl initially. He then went to ED on 2-22 and was given steroid injection and rash resolved.  His AFB Cx has had minimal growth. Has had this probed and was TB (-).  His only foreign travel was [place] 10 years ago.  I spoke with [facility] and [facility] with advice to start him on rifampin 600mg  daily, ETH 20/kg/daily, azithro 500mg  MWF and 250mg  T/TH. Treatment to last 1 year. Monthly CBC, CMP, audio test at baseline, home visual acuity testing. Rescan at 3-6 months.  He was called to come and start this rx.    Review of Systems  Constitutional: Negative for fever and chills.  Respiratory: Positive for shortness of breath. Negative for cough.   Cardiovascular: Negative for leg swelling.       Objective:   Physical Exam    Constitutional: He appears well-developed and well-nourished.  Eyes: EOM are normal. Pupils are equal, round, and reactive to light.  Neck: Neck supple.  Cardiovascular: Normal rate, regular rhythm and normal heart sounds.   Pulmonary/Chest: Effort normal and breath sounds normal. No respiratory distress. He has no wheezes.  Abdominal: Soft. Bowel sounds are normal. He exhibits no distension. There is no tenderness.  Lymphadenopathy:    He has no cervical adenopathy.          Assessment & Plan:

## 2011-09-04 NOTE — Telephone Encounter (Signed)
Today's office note faxed to [facility] attention Roddie Mc [phone].  She needed confirmation that patient does not have TB. Wendall Mola CMA

## 2011-09-04 NOTE — Telephone Encounter (Signed)
Patient called and advised that he was here in clinic today and discussed medication with the provider. He advised that at the time he did not want to take the meds but after speaking with his wife has had a change of heart and wants the medication. Patient was not able to tell me the names of the meds. Advised the patient will speak with the provider and get the meds ordered asap. Advised the patient will call back if we need anything further from him.  Spoke with the providers CMA and advised her of the patient change of heart and she advised she will page the provider to see how he wants to proceed. And also find out what meds were discussed.

## 2011-09-10 ENCOUNTER — Telehealth: Payer: Self-pay | Admitting: *Deleted

## 2011-09-10 NOTE — Telephone Encounter (Signed)
Patient called c/o rash after taking medications for MAC.  He had previously broken out and MD unsure which medication had caused this.  He did not get a rash when taking the azithromycin, but on the weekend when he took the Rifampin he broke out.  Spoke with Dr. Ninetta Lights and he advised patient to stop all meds for one week or until rash cleared, then restart everything except the Rifampin.  He is to call the clinic if he gets another rash while off the Rifampin. Wendall Mola CMA

## 2011-09-12 ENCOUNTER — Ambulatory Visit: Payer: 59 | Admitting: Cardiothoracic Surgery

## 2011-09-13 ENCOUNTER — Emergency Department (HOSPITAL_COMMUNITY): Payer: 59

## 2011-09-13 ENCOUNTER — Encounter (HOSPITAL_COMMUNITY): Payer: Self-pay | Admitting: Emergency Medicine

## 2011-09-13 ENCOUNTER — Emergency Department (HOSPITAL_COMMUNITY)
Admission: EM | Admit: 2011-09-13 | Discharge: 2011-09-13 | Disposition: A | Discharge status: Home / Self Care | Payer: 59 | Attending: Emergency Medicine | Admitting: Emergency Medicine

## 2011-09-13 DIAGNOSIS — J438 Other emphysema: Secondary | ICD-10-CM | POA: Insufficient documentation

## 2011-09-13 DIAGNOSIS — Z7982 Long term (current) use of aspirin: Secondary | ICD-10-CM | POA: Insufficient documentation

## 2011-09-13 DIAGNOSIS — Z87891 Personal history of nicotine dependence: Secondary | ICD-10-CM | POA: Insufficient documentation

## 2011-09-13 DIAGNOSIS — T7840XA Allergy, unspecified, initial encounter: Secondary | ICD-10-CM | POA: Insufficient documentation

## 2011-09-13 DIAGNOSIS — E785 Hyperlipidemia, unspecified: Secondary | ICD-10-CM | POA: Insufficient documentation

## 2011-09-13 DIAGNOSIS — Z885 Allergy status to narcotic agent status: Secondary | ICD-10-CM | POA: Insufficient documentation

## 2011-09-13 DIAGNOSIS — Z79899 Other long term (current) drug therapy: Secondary | ICD-10-CM | POA: Insufficient documentation

## 2011-09-13 DIAGNOSIS — I10 Essential (primary) hypertension: Secondary | ICD-10-CM | POA: Insufficient documentation

## 2011-09-13 DIAGNOSIS — L509 Urticaria, unspecified: Secondary | ICD-10-CM | POA: Insufficient documentation

## 2011-09-13 DIAGNOSIS — R079 Chest pain, unspecified: Secondary | ICD-10-CM | POA: Insufficient documentation

## 2011-09-13 DIAGNOSIS — Z881 Allergy status to other antibiotic agents status: Secondary | ICD-10-CM | POA: Insufficient documentation

## 2011-09-13 DIAGNOSIS — I2510 Atherosclerotic heart disease of native coronary artery without angina pectoris: Secondary | ICD-10-CM | POA: Insufficient documentation

## 2011-09-13 DIAGNOSIS — K219 Gastro-esophageal reflux disease without esophagitis: Secondary | ICD-10-CM | POA: Insufficient documentation

## 2011-09-13 DIAGNOSIS — F419 Anxiety disorder, unspecified: Secondary | ICD-10-CM

## 2011-09-13 LAB — POCT I-STAT TROPONIN I
Troponin i, poc: 0.01 ng/mL (ref 0.00–0.08)
Troponin i, poc: 0 ng/mL (ref 0.00–0.08)

## 2011-09-13 LAB — DIFFERENTIAL
Eosinophils Relative: 3 % (ref 0–5)
Lymphocytes Relative: 34 % (ref 12–46)
Lymphs Abs: 2.5 10*3/uL (ref 0.7–4.0)
Monocytes Absolute: 0.9 10*3/uL (ref 0.1–1.0)
Monocytes Relative: 12 % (ref 3–12)

## 2011-09-13 LAB — POCT I-STAT, CHEM 8
BUN: 16 mg/dL (ref 6–23)
Chloride: 103 mEq/L (ref 96–112)
Creatinine, Ser: 1.2 mg/dL (ref 0.50–1.35)
Potassium: 3.5 mEq/L (ref 3.5–5.1)
Sodium: 139 mEq/L (ref 135–145)

## 2011-09-13 LAB — CBC
HCT: 40.2 % (ref 39.0–52.0)
Hemoglobin: 14 g/dL (ref 13.0–17.0)
MCV: 89.1 fL (ref 78.0–100.0)
RBC: 4.51 MIL/uL (ref 4.22–5.81)
RDW: 13.1 % (ref 11.5–15.5)
WBC: 7.4 10*3/uL (ref 4.0–10.5)

## 2011-09-13 MED ORDER — DEXAMETHASONE SODIUM PHOSPHATE 10 MG/ML IJ SOLN
10.0000 mg | Freq: Once | INTRAMUSCULAR | Status: AC
  Administered 2011-09-13: 10 mg via INTRAVENOUS
  Filled 2011-09-13: qty 1

## 2011-09-13 MED ORDER — FAMOTIDINE IN NACL 20-0.9 MG/50ML-% IV SOLN
20.0000 mg | Freq: Once | INTRAVENOUS | Status: AC
  Administered 2011-09-13: 20 mg via INTRAVENOUS
  Filled 2011-09-13: qty 50

## 2011-09-13 MED ORDER — FAMOTIDINE 40 MG PO TABS: 40.0000 mg | ORAL_TABLET | Freq: Two times a day (BID) | ORAL | Status: DC

## 2011-09-13 MED ORDER — DIPHENHYDRAMINE HCL 50 MG/ML IJ SOLN
25.0000 mg | Freq: Once | INTRAMUSCULAR | Status: AC
  Administered 2011-09-13: 25 mg via INTRAVENOUS
  Filled 2011-09-13: qty 1

## 2011-09-13 MED ORDER — PREDNISONE 20 MG PO TABS: ORAL_TABLET | ORAL | Status: AC

## 2011-09-13 NOTE — ED Provider Notes (Addendum)
History     CSN: [id number]  Arrival date & time 09/13/11  0247   First MD Initiated Contact with Patient 09/13/11 0310      Chief Complaint  Patient presents with  . Allergic Reaction    (Consider location/radiation/quality/duration/timing/severity/associated sxs/prior treatment) Patient is a 61 y.o. male presenting with allergic reaction. The history is provided by the patient.  Allergic Reaction The primary symptoms are  rash and urticaria. The primary symptoms do not include shortness of breath, nausea, vomiting, palpitations or angioedema. The current episode started more than 2 days ago. The problem has been gradually worsening. This is a new problem.  The rash is associated with itching.  The urticaria began more than 2 days ago. The urticaria has been gradually worsening since its onset. Urticaria is a new problem. Urticaria is located on the back and abdomen. The onset of urticaria was associated with scratching of the skin (has been on azithromycin and rifampin).  The onset of the reaction was associated with a new medication. Significant symptoms also include itching.  Also describes 3 hours of chest tightness this evening with symptoms.  No DOE, no SOB no n/v/d.  Still present  Past Medical History  Diagnosis Date  . Hypertension   . Emphysema   . Hyperlipidemia   . Allergic rhinitis   . Coronary artery disease     sm blockages on 2007 cath  . COPD (chronic obstructive pulmonary disease)     emyphesema  also  . Shortness of breath     DOE  . Recurrent upper respiratory infection (URI)     NOV  & DECEMBER 2012  . GERD (gastroesophageal reflux disease)   . Headache   . HOH (hard of hearing)     BOTH.....Marland KitchenLEFT IS BETTER THEN RIGHT    Past Surgical History  Procedure Date  . Eye muscle surgery x3     2 left eye, 1 right eye  . Cardiac catheterization     2ND CATH IN 2007 @  [facility]  . Eye surgery   . Back surgery     LOWER WITH FUSION  . Knee surgery, left    . Video bronchoscopy 05/08/2011    Procedure: VIDEO BRONCHOSCOPY;  Surgeon: Delight Ovens, MD;  Location: [facility] OR;  Service: Thoracic;  Laterality: N/A;    Family History  Problem Relation Age of Onset  . Emphysema Mother   . Allergies Brother   . Allergies Sister     x2  . Heart disease Sister   . Heart failure Father   . Heart disease Mother   . Esophageal cancer Cousin   . Stomach cancer Cousin   . Lymphoma Cousin     History  Substance Use Topics  . Smoking status: Former Smoker -- 1.0 packs/day for 40 years    Types: Cigarettes    Quit date: 04/18/2011  . Smokeless tobacco: Current User    Types: Snuff   Comment: uses pouches 3 times a day  . Alcohol Use: Yes     rare ETOH use      Review of Systems  Respiratory: Positive for chest tightness. Negative for shortness of breath.   Cardiovascular: Negative for chest pain, palpitations and leg swelling.  Gastrointestinal: Negative for nausea and vomiting.  Skin: Positive for itching and rash.  All other systems reviewed and are negative.    Allergies  Omnipaque; Avelox; and Codeine  Home Medications   Current Outpatient Rx  Name Route Sig Dispense  Refill  . ALBUTEROL SULFATE (2.5 MG/3ML) 0.083% IN NEBU Nebulization Take 3 mLs (2.5 mg total) by nebulization 4 (four) times daily. 360 mL 6  . ASPIRIN 81 MG PO TABS Oral Take 81 mg by mouth 2 (two) times daily.     . AZITHROMYCIN 250 MG PO TABS  Take one tablet on Tuesdays and Thursdays 8 each 11  . AZITHROMYCIN 500 MG PO TABS Oral Take 1 tablet (500 mg total) by mouth 3 (three) times a week. Take one tablet  Monday, Wednesday, and Friday 15 tablet 11  . BENICAR HCT 40-12.5 MG PO TABS Oral Take 1 tablet by mouth daily. Once daily    . CRESTOR 10 MG PO TABS Oral Take 10 mg by mouth daily. Once daily    . ETHAMBUTOL HCL 100 MG PO TABS Oral Take 2 tablets (200 mg total) by mouth daily. Take two tablets once daily 60 tablet 11  . ETHAMBUTOL HCL 400 MG PO TABS Oral Take  2 tablets (800 mg total) by mouth daily. Take 2 tablets once daily 60 tablet 11  . IPRATROPIUM BROMIDE 0.02 % IN SOLN Nebulization Take 2.5 mLs (500 mcg total) by nebulization 4 (four) times daily. 300 mL 6  . LORATADINE 10 MG PO TABS Oral Take 10 mg by mouth daily.    . ADULT MULTIVITAMIN W/MINERALS CH Oral Take 1 tablet by mouth daily.    [name] OMEPRAZOLE 20 MG PO CPDR Oral Take 1 capsule by mouth daily.    [name] RIFAMPIN 300 MG PO CAPS Oral Take 2 capsules (600 mg total) by mouth daily. 60 capsule 11  . VENTOLIN HFA 108 (90 BASE) MCG/ACT IN AERS Inhalation Inhale 2 puffs into the lungs every 6 (six) hours as needed. For shortness of breath      BP 151/90  Pulse 69  Temp 97.7 F (36.5 C) (Oral)  Resp 18  SpO2 97%  Physical Exam  Constitutional: He is oriented to person, place, and time. He appears well-developed and well-nourished. No distress.  HENT:  Head: Normocephalic and atraumatic.  Mouth/Throat: Oropharynx is clear and moist.       No swelling of the lips tongue or uvula  Eyes: Conjunctivae are normal. Pupils are equal, round, and reactive to light.  Neck: Normal range of motion. Neck supple.  Cardiovascular: Normal rate and regular rhythm.   Pulmonary/Chest: Effort normal and breath sounds normal. He has no wheezes.  Abdominal: Soft. Bowel sounds are normal. There is no tenderness. There is no rebound and no guarding.  Musculoskeletal: Normal range of motion. He exhibits no edema.  Neurological: He is alert and oriented to person, place, and time.  Skin: Rash noted. He is not diaphoretic.       Hives of the back and trunk  Psychiatric: He has a normal mood and affect.    ED Course  Procedures (including critical care time)   Labs Reviewed  POCT I-STAT, CHEM 8  CBC  DIFFERENTIAL   No results found.   No diagnosis found.    MDM   Date: 09/13/2011  Rate: 64  Rhythm: normal sinus rhythm  QRS Axis: normal  Intervals: borderline AV conduction delay  ST/T Wave  abnormalities: normal  Conduction Disutrbances: none  Narrative Interpretation: unremarkable  Wells score 0 2 troponins negative in the setting of ongoing chest discomfort reassuring.  Suspect tightness is consistent with allergic reaction and some superimprosed anxiety secondary to ongoing rash.  No suspicion for PE.  Has not been bed  bound.  No SOB, sats 100% plain not inspirational, not pleuritic no hemoptysis no leg pain nor swelling.       Return immediately for leg pain or swelling chest pain shortness of breath nausea sweatiness, worsening of hives, swelling of the lips or tongue or any concerns.  Stop taking azithro and rifampin and inform Dr. Ninetta Lights of this patient verbalizes understanding and agrees to follow up       Angeliah Wisdom K Seena Face-Rasch, MD 09/13/11 0640  Shana Younge K Rosalea Withrow-Rasch, MD 09/13/11 [phone]

## 2011-09-13 NOTE — ED Notes (Signed)
Rx x 2, pt voiced understanding to f/u with PCP as well as to return for worsening symptoms

## 2011-09-13 NOTE — ED Notes (Signed)
PT. PRESENTS WITH GENERALIZED ITCHY RASHES/HIVES FOR 4 DAYS , RESPIRATIONS UNLABORED , AIRWAY INTACT , UNSURE OF ETIOLOGY .

## 2011-09-13 NOTE — Discharge Instructions (Signed)

## 2011-09-13 NOTE — ED Notes (Signed)
Pt presents with rash to left abdomen/back, buttocks, and left arm.  States is being treated for mass on right lung, seeing surgeon and infections disease MD.  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  Has had this rash since Monday but it is spreading.  Has had rashs intermittently since being on medication to treat possible TB.

## 2011-09-16 ENCOUNTER — Telehealth: Payer: Self-pay | Admitting: *Deleted

## 2011-09-16 ENCOUNTER — Encounter: Payer: Self-pay | Admitting: *Deleted

## 2011-09-16 ENCOUNTER — Encounter: Payer: Self-pay | Admitting: Internal Medicine

## 2011-09-16 ENCOUNTER — Ambulatory Visit (INDEPENDENT_AMBULATORY_CARE_PROVIDER_SITE_OTHER): Payer: 59 | Admitting: Internal Medicine

## 2011-09-16 VITALS — BP 144/82 | HR 80 | Temp 98.5°F | Wt 142.0 lb

## 2011-09-16 DIAGNOSIS — R21 Rash and other nonspecific skin eruption: Secondary | ICD-10-CM

## 2011-09-16 NOTE — Patient Instructions (Addendum)
The third medication that Dr. Ninetta Lights had wanted you to take was ethambutol.

## 2011-09-16 NOTE — Progress Notes (Signed)
Patient ID: Caleb [name], male   DOB: 02-10-1951, 61 y.o.   MRN: [id number]    [facility] for Infectious Disease  Patient Active Problem List  Diagnosis  . Chest mass  . COPD (chronic obstructive pulmonary disease)  . Tobacco abuse  . Coronary atherosclerosis of native coronary artery  . Mycobacterial disease  . Rash    Patient's Medications  New Prescriptions   No medications on file  Previous Medications   ALBUTEROL (PROVENTIL) (2.5 MG/3ML) 0.083% NEBULIZER SOLUTION    Take 3 mLs (2.5 mg total) by nebulization 4 (four) times daily.   ASPIRIN 81 MG TABLET    Take 81 mg by mouth 2 (two) times daily.    AZITHROMYCIN (ZITHROMAX) 250 MG TABLET    Take one tablet on Tuesdays and Thursdays   AZITHROMYCIN (ZITHROMAX) 500 MG TABLET    Take 1 tablet (500 mg total) by mouth 3 (three) times a week. Take one tablet  Monday, Wednesday, and Friday   BENICAR HCT 40-12.5 MG PER TABLET    Take 1 tablet by mouth daily. Once daily   CRESTOR 10 MG TABLET    Take 10 mg by mouth daily. Once daily   FAMOTIDINE (PEPCID) 40 MG TABLET    Take 1 tablet (40 mg total) by mouth 2 (two) times daily.   IPRATROPIUM (ATROVENT) 0.02 % NEBULIZER SOLUTION    Take 2.5 mLs (500 mcg total) by nebulization 4 (four) times daily.   LORATADINE (CLARITIN) 10 MG TABLET    Take 10 mg by mouth daily.   MULTIPLE VITAMIN (MULITIVITAMIN WITH MINERALS) TABS    Take 1 tablet by mouth daily.   OMEPRAZOLE (PRILOSEC) 20 MG CAPSULE    Take 1 capsule by mouth daily.   PREDNISONE (DELTASONE) 20 MG TABLET    2 tabs po daily x 3 days   RIFAMPIN (RIFADIN) 300 MG CAPSULE    Take 2 capsules (600 mg total) by mouth daily.   VENTOLIN HFA 108 (90 BASE) MCG/ACT INHALER    Inhale 2 puffs into the lungs every 6 (six) hours as needed. For shortness of breath  Modified Medications   No medications on file  Discontinued Medications   No medications on file    Subjective: Mr. Fick is seen on a work in basis. He was hospitalized earlier this  year with a lung nodule that was resected. Sounds like it was found to be mycobacterial infection. He was started on empiric 4 drug TB therapy while in the hospital and he tells me he developed a severe rash. Cultures eventually grew Mycobacterium avium. He followed up with Dr. Ninetta Lights on June 5 at which time he recommended starting rifampin, ethambutol and azithromycin. He started taking rifampin and azithromycin the following day. He states that he never received the ethambutol from his pharmacy although our records indicate that it was ordered. He took the rifampin and azithromycin as prescribed and developed a rash on June 10 similar to what he had in the hospital. He called here on June 11 and was told to stop his antibiotics and take Benadryl. His rash is slowly faded. He has not had any fever, chills or sweats. His baseline smoker's cough is unchanged.  Objective: Temp: 98.5 F (36.9 C) (06/17 1502) Temp src: Oral (06/17 1502) BP: 144/82 mmHg (06/17 1502) Pulse Rate: 80  (06/17 1502)  General: He is frustrated but in no other distress Skin: The rash that he shows me on his cell phone camera has resolved. He  does have some excoriations on his arms Lungs: Clear  Assessment: There is like he had another allergic reaction to his antibiotics. Since he was not taking ethambutol it is most likely due to rifampin since that would've been in his empiric TB regimen as well. He is adamant that he does not want to take any antibiotics at this time. He states that he had been worried about developing another rash before talking with Dr. Ninetta Lights and he is also very concerned about the cost of the medications. He is currently asymptomatic as best I can tell. He is scheduled for a followup chest CT scan on August 8 and will see Dr. Ninetta Lights on August 12.  Plan: 1. Observe off of antibiotics 2. Followup after repeat chest CT scan in August   Cliffton Asters, MD [facility] for Infectious Disease [facility] [phone] pager   [phone] cell 09/16/2011, 3:52 PM

## 2011-09-16 NOTE — Telephone Encounter (Signed)
error 

## 2011-09-16 NOTE — Telephone Encounter (Signed)
He called to report that he is still having problems with a rash . It occurs daily or bid. He went to ED last Friday for this. He is not taking the rifampin or azithromycin.  Gave appt today with Dr. Orvan Falconer at 3:15.

## 2011-09-26 ENCOUNTER — Ambulatory Visit: Payer: 59 | Admitting: Cardiothoracic Surgery

## 2011-10-10 ENCOUNTER — Ambulatory Visit: Payer: 59 | Admitting: Cardiothoracic Surgery

## 2011-10-17 ENCOUNTER — Ambulatory Visit (INDEPENDENT_AMBULATORY_CARE_PROVIDER_SITE_OTHER): Payer: 59 | Admitting: Cardiothoracic Surgery

## 2011-10-17 ENCOUNTER — Ambulatory Visit
Admission: RE | Admit: 2011-10-17 | Discharge: 2011-10-17 | Disposition: A | Discharge status: Home / Self Care | Payer: 59 | Source: Ambulatory Visit | Attending: Cardiothoracic Surgery | Admitting: Cardiothoracic Surgery

## 2011-10-17 ENCOUNTER — Other Ambulatory Visit: Payer: Self-pay | Admitting: Cardiothoracic Surgery

## 2011-10-17 ENCOUNTER — Encounter: Payer: Self-pay | Admitting: Cardiothoracic Surgery

## 2011-10-17 ENCOUNTER — Ambulatory Visit: Payer: 59 | Admitting: Cardiothoracic Surgery

## 2011-10-17 VITALS — BP 103/68 | HR 67 | Resp 18 | Ht 66.0 in | Wt 144.0 lb

## 2011-10-17 DIAGNOSIS — R222 Localized swelling, mass and lump, trunk: Secondary | ICD-10-CM

## 2011-10-17 DIAGNOSIS — Z09 Encounter for follow-up examination after completed treatment for conditions other than malignant neoplasm: Secondary | ICD-10-CM

## 2011-10-17 DIAGNOSIS — Z720 Tobacco use: Secondary | ICD-10-CM

## 2011-10-17 DIAGNOSIS — F17200 Nicotine dependence, unspecified, uncomplicated: Secondary | ICD-10-CM

## 2011-10-17 NOTE — Progress Notes (Signed)
[address]            [place] [address]          [id number]        HALO LASKI [facility] Medical Record #[id number] Date of Birth: 01-01-1951  Marylen Ponto, MD Dr Ninetta Lights, MD  Chief Complaint:   PostOp Follow Up Visit PREOPERATIVE DIAGNOSIS: New right upper lobe lung lesion, suspicious  for carcinoma of the lung.  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  PROCEDURE PERFORMED: Bronchoscopy, right video-assisted thoracoscopy,  mini thoracotomy, wedge resection of right upper lobe lung lesion,  placement of On-Q. 05/08/2011  History of Present Illness:     Patient is doing well following his resection of right upper lobe lung nodule which on final path was a granuloma, 1 AFB smear was positive and the patient was started on tuberculosis medications. In March he developed severe rash was seen in his primary care office and in the emergency room and according to the patient in consultation with the public health Department stopped the tuberculosis medications. He was restarted on antibiotic regimen a second time and again developed severe rash and is now off therapy. Overall he feels well denies any cough. He has been smoke-free for 6 months as of today.  He continues to use chewing tobacco.  History  Smoking status  . Former Smoker -- 1.0 packs/day for 40 years  . Types: Cigarettes  . Quit date: 04/18/2011  Smokeless tobacco  . Current User  . Types: Snuff  Comment: uses pouches 3 times a day       Allergies  Allergen Reactions  . Omnipaque (Iohexol)   . Avelox (Moxifloxacin Hcl In Nacl) Rash  . Codeine Rash    Current Outpatient Prescriptions  Medication Sig Dispense Refill  . albuterol (PROVENTIL) (2.5 MG/3ML) 0.083% nebulizer solution Take 3 mLs (2.5 mg total) by nebulization 4 (four) times daily.  360 mL  6  . aspirin 81 MG tablet  Take 81 mg by mouth 2 (two) times daily.       [name] BENICAR HCT 40-12.5 MG per tablet Take 1 tablet by mouth daily. Once daily      . CRESTOR 10 MG tablet Take 10 mg by mouth daily. Once daily      . ipratropium (ATROVENT) 0.02 % nebulizer solution Take 2.5 mLs (500 mcg total) by nebulization 4 (four) times daily.  300 mL  6  . loratadine (CLARITIN) 10 MG tablet Take 10 mg by mouth daily.      . Multiple Vitamin (MULITIVITAMIN WITH MINERALS) TABS Take 1 tablet by mouth daily.      [name] omeprazole (PRILOSEC) 20 MG capsule Take 1 capsule by mouth daily.      . VENTOLIN HFA 108 (90 BASE) MCG/ACT inhaler Inhale 2 puffs into the lungs every 6 (six) hours as needed. For shortness of breath           Physical Exam: BP 103/68  Pulse 67  Resp 18  Ht 5\' 6"  (1.676 m)  Wt 144 lb (65.318 kg)  BMI 23.24 kg/m2  SpO2 100%  General appearance: alert, cooperative and no distress Neurologic: intact Heart: regular rate and rhythm, S1, S2 normal, no murmur, click, rub or gallop  Lungs: clear to auscultation bilaterally Abdomen: soft, non-tender; bowel sounds normal; no masses,  no organomegaly Extremities: extremities normal, atraumatic, no cyanosis or edema and Homans sign is negative, no sign of DVT Wounds:right chest incision sites well healed  Diagnostic Studies & Laboratory data:         Recent Radiology Findings:  Dg Chest 2 View  10/17/2011  *RADIOLOGY REPORT*  Clinical Data: History of lung surgery in February, VATS with right upper lobe lesion consistent with granulomatous disease  CHEST - 2 VIEW  Comparison: Chest x-ray of 09/13/2011  Findings: Scarring in the right lung apex is noted.  No active infiltrate or effusion is seen.  There is mild peribronchial thickening present.  The lungs are hyperaerated consistent with a degree of COPD.  Mediastinal contours are stable.  The heart is within normal limits in size.  No bony abnormality is seen.  IMPRESSION: COPD and probable bronchitis.  Scarring in  the right upper lobe. No active process.  Original Report Authenticated By: Juline Patch, M.D.      Recent Labs: Lab Results  Component Value Date   WBC 7.4 09/13/2011   HGB 14.3 09/13/2011   HCT 42.0 09/13/2011   PLT 247 09/13/2011   GLUCOSE 97 09/13/2011   ALT 18 05/10/2011   AST 33 05/10/2011   NA 139 09/13/2011   K 3.5 09/13/2011   CL 103 09/13/2011   CREATININE 1.20 09/13/2011   BUN 16 09/13/2011   CO2 28 05/10/2011   INR 0.94 05/06/2011      Assessment / Plan:      Patient doing well following wedge resection of right upper lobe lesion, no malignancy was found. Smears did indicate presence of AFB subsequent tests have not confirmed this. The patient was seen by Dr. Ninetta Lights during this hospitalization who recommended tuberculosis treatment. 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 He has an appointment for followup in ID clinic, with a repeat CT scan of the chest in August. I plan to see him back in 6 months   Delight Ovens MD 10/17/2011 2:07 PM

## 2011-11-06 ENCOUNTER — Other Ambulatory Visit: Payer: 59

## 2011-11-06 ENCOUNTER — Other Ambulatory Visit: Payer: Self-pay | Admitting: Infectious Diseases

## 2011-11-06 DIAGNOSIS — R222 Localized swelling, mass and lump, trunk: Secondary | ICD-10-CM

## 2011-11-06 LAB — BASIC METABOLIC PANEL
Calcium: 9.3 mg/dL (ref 8.4–10.5)
Potassium: 4.3 mEq/L (ref 3.5–5.3)
Sodium: 136 mEq/L (ref 135–145)

## 2011-11-07 ENCOUNTER — Ambulatory Visit (HOSPITAL_COMMUNITY)
Admission: RE | Admit: 2011-11-07 | Discharge: 2011-11-07 | Disposition: A | Discharge status: Home / Self Care | Payer: 59 | Source: Ambulatory Visit | Attending: Infectious Diseases | Admitting: Infectious Diseases

## 2011-11-07 DIAGNOSIS — R222 Localized swelling, mass and lump, trunk: Secondary | ICD-10-CM

## 2011-11-07 DIAGNOSIS — Z09 Encounter for follow-up examination after completed treatment for conditions other than malignant neoplasm: Secondary | ICD-10-CM | POA: Insufficient documentation

## 2011-11-07 DIAGNOSIS — J438 Other emphysema: Secondary | ICD-10-CM | POA: Insufficient documentation

## 2011-11-07 MED ORDER — IOHEXOL 300 MG/ML  SOLN
80.0000 mL | Freq: Once | INTRAMUSCULAR | Status: AC | PRN
  Administered 2011-11-07: 80 mL via INTRAVENOUS

## 2011-11-11 ENCOUNTER — Ambulatory Visit: Payer: 59 | Admitting: Infectious Diseases

## 2011-11-13 ENCOUNTER — Encounter: Payer: Self-pay | Admitting: Infectious Diseases

## 2011-11-13 ENCOUNTER — Ambulatory Visit (INDEPENDENT_AMBULATORY_CARE_PROVIDER_SITE_OTHER): Payer: 59 | Admitting: Infectious Diseases

## 2011-11-13 VITALS — BP 117/78 | HR 70 | Temp 97.5°F | Ht 66.0 in | Wt 141.0 lb

## 2011-11-13 DIAGNOSIS — A319 Mycobacterial infection, unspecified: Secondary | ICD-10-CM

## 2011-11-13 NOTE — Assessment & Plan Note (Signed)
He is doing well. The etiology of his syndrome is an unclear mycobacterial infection. I would typically treat him for this but he was intolerant of one of the medications. Alternatively, could make the case that his surgery resolved his mass. He is asx .  He will return in 1 year with a f/u CT to be done at that time.  If he has any change in sx, or if he has any change on his f/u CT, will re-consult [facility] and consider alternative regimen.

## 2011-11-13 NOTE — Progress Notes (Signed)
  Subjective:    Patient ID: Caleb [name], male    DOB: 06-Oct-1950, 61 y.o.   MRN: [id number]  HPI 61 yo M with hx of HTN, COPD, hyperlipidemia. States he had CXR (-) July 2011. In November 2012 had URI. Took course of anbx and this improved but then recurred in December. He was seen and asked for CXR as he was having chest pain in his R upper chest. This was found to show a mass. He underwent a CT scan And then a PET scan (04-10-11):The right apical lung nodule demonstrates primarily low level nonmalignant range hypermetabolism. A portion of its peripheral/pleural-based component demonstrates borderline malignant range hypermetabolism. 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 His serum quantiferon gold was (-). A PCR done on his VATS specimen was (-) for TB as well. He was started on 4 drug TB rx and sent home 05-14-11. He followed up with his health dept and continued on 4 drug rx until 05-23-11. He developed a rash at that time. His rash improved with benadryl initially. He then went to ED on 2-22 and was given steroid injection and rash resolved.  His AFB Cx has had minimal growth. Has had this probed and was TB (-).  His only foreign travel was [place] 10 years ago.  I spoke with Health Dept and [facility] with advice to start him on rifampin 600mg  daily, ETH 20/kg/daily, azithro 500mg  MWF and 250mg  T/TH. Treatment to last 1 year. He was seen in ID and started on these medicines. He took Rif/Azithro (did not get [facility]) and quickly developed a rash. He stopped his meds on June 10 (had been on less than 1 week).  He underwent repeat CT scan (11-07-11)- 1. Stable CT of the chest. Stable postop change within the right upper lobe without evidence for recurrence of nodules/mass. 2. Mild  emphysema. Has been feeling fine, gets tired easy but has been working hard as well.  Has occas cough with pleuritic R chest pain, has SOB with exertion, can walk "all day", sob mostly with trying to move heavy objects.  No fevers or chills, no swollen glands (LAN).    Review of Systems     Objective:   Physical Exam  Constitutional: He appears well-developed and well-nourished.  Eyes: EOM are normal. Pupils are equal, round, and reactive to light.  Neck: Neck supple.  Cardiovascular: Normal rate, regular rhythm and normal heart sounds.   Pulmonary/Chest: Effort normal and breath sounds normal.  Abdominal: Soft. Bowel sounds are normal. He exhibits no distension. There is no tenderness.  Lymphadenopathy:    He has no cervical adenopathy.    He has no axillary adenopathy.       Right: No supraclavicular adenopathy present.       Left: No supraclavicular adenopathy present.          Assessment & Plan:

## 2011-11-14 ENCOUNTER — Ambulatory Visit: Payer: 59 | Admitting: Cardiothoracic Surgery

## 2011-11-27 ENCOUNTER — Ambulatory Visit: Payer: 59 | Admitting: Infectious Diseases

## 2011-12-03 ENCOUNTER — Ambulatory Visit (INDEPENDENT_AMBULATORY_CARE_PROVIDER_SITE_OTHER): Payer: 59 | Admitting: Emergency Medicine

## 2011-12-03 ENCOUNTER — Encounter: Payer: Self-pay | Admitting: Emergency Medicine

## 2011-12-03 ENCOUNTER — Other Ambulatory Visit: Payer: Self-pay | Admitting: *Deleted

## 2011-12-03 VITALS — BP 136/88 | HR 72 | Temp 98.2°F | Ht 66.0 in | Wt 141.6 lb

## 2011-12-03 DIAGNOSIS — J449 Chronic obstructive pulmonary disease, unspecified: Secondary | ICD-10-CM

## 2011-12-03 DIAGNOSIS — A319 Mycobacterial infection, unspecified: Secondary | ICD-10-CM

## 2011-12-03 MED ORDER — ALBUTEROL SULFATE (2.5 MG/3ML) 0.083% IN NEBU: 2.5000 mg | INHALATION_SOLUTION | Freq: Four times a day (QID) | RESPIRATORY_TRACT | Status: DC

## 2011-12-03 MED ORDER — IPRATROPIUM BROMIDE 0.02 % IN SOLN: 500.0000 ug | Freq: Four times a day (QID) | RESPIRATORY_TRACT | Status: DC

## 2011-12-03 NOTE — Progress Notes (Signed)
Subjective:    Patient ID: Caleb [name], male    DOB: Dec 09, 1950, 61 y.o.   MRN: [id number] HPI 61 yo man, smoker (80 pk-yrs), hx of COPD on Symbicort + prn SABA. Also hx allergies and HTN.  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 TB  Spirometry 04/17/11>FEV1 43%, ratio 50   04/17/11 New Consult  Referred for CT scan chest with RUL mass in 04/01/2011. PET scan 04/10/11.  PET scan (04-10-11):The right apical lung nodule demonstrates primarily low level nonmalignant range hypermetabolism. A portion of its peripheral/pleural-based component demonstrates borderline malignant range hypermetabolism. He is feeling well, not coughing. The scan was prompted by URI and bronchitis in November and December. Has lot about 20 lbs over 3 months.  >>ENB (Mass resected, granuloma with question of TB, AFB neg )  07/23/2011 Acute OV  Complains of prod cough with yellow/brown mucus, increased SOB, chills/sweats x4days . Went to UC on 4.20.13 and was given septra ds. Patient presents today complaining that his cough is not any better, and he's having trouble sleeping at night. He complains of thick, yellow-brown mucus, and wheezing. He denies any hemoptysis, weight loss, chest pain, orthopnea, PND, or leg swelling. Is not taking any over-the-counter medications. He says that he was seen at urgent care with a chest x-ray done. Those results and records are not available at today's visit.  Pt was seen in January for lung mass consultation . He was adm to the hospital 05-08-11 and underwent VATS. Path showed a  necrotizing granuloma and AFB positive smear from microbiology.   A PCR done on his VATS specimen was (-) for TB as well. He was started on 4 drug TB rx per ID . He was not able to tolerate these meds with multiple side effects with ER and UC visits due to this.  He has been followed by ID , and is currently awaiting final results from reculture of AFB   ROV 08/22/11 -- 61 yo man, tobacco quit 1/13. 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 He had a short course 4 drug therapy, all of his eval has been PCR negative for TB so far. He has a repeat CT scan ordered for August. He is taking Symbicort but only 1 puff bid, has SABA that he uses rarely. Was treated for AE 4/23 with pred. He is not having fevers, he does have night sweats but not every night. He coughs when he lays flat, better with his head up. He is on omeprazole. He has significant fatigue.  Has SOB with activity.   ROV 12/03/11 -- 61 yo man, tobacco quit 1/13. 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 He had a short course 4 drug therapy, all of his eval has been PCR negative for TB. Has been followed by Dr Ninetta Lights, tried to take meds for TB even w no true cx data, but he was unable to tolerate. He had a repeat CT scan chest on 11/07/11 that shows no new nodules or lesions. Plan for now is to follow in a year, reassess sooner if any new sx. He does have some night sweats, but he has had these for 3 -4 years. He has some SOB with exertion. No exacerbations since last time.       Objective:   Filed Vitals:   12/03/11 1159  BP: 136/88  Pulse: 72  Temp: 98.2 F (36.8 C)  Physical Exam GEN: A/Ox3; pleasant , NAD, well nourished   HEENT:  [place]/AT,  EACs-clear, TMs-wnl, NOSE-clear, THROAT-clear, no lesions, no postnasal drip or exudate noted.   NECK:  Supple w/ fair ROM; no JVD; normal carotid impulses w/o bruits; no thyromegaly or nodules palpated; no lymphadenopathy.  RESP  Coarse BS w/ faint exp wheeze .no accessory muscle use, no dullness to percussion  CARD:  RRR, no m/r/g  , no peripheral edema, pulses intact, no cyanosis or clubbing.  Musco: Warm bil, no deformities or joint swelling noted.   Neuro: alert, no focal deficits noted.    Skin: Warm, no lesions or rashes    CT chest 11/07/11 --  Comparison: PET CT from 04/10/2011.  Findings: No  supraclavicular or axillary lymph nodes. There is no  enlarged mediastinal or hilar lymph nodes. No pericardial or  pleural effusion.  Postop change involving the right upper lobe is identified  consistent with wedge resection surgery.  No new or enlarging pulmonary nodules or masses identified. Mild  changes of emphysema identified.  Review of the visualized bony structures is unremarkable. No  worrisome lytic or sclerotic bone lesions identified.  IMPRESSION:  1. Stable CT of the chest. Stable postop change within the right  upper lobe without evidence for recurrence of nodules/mass  2. Mild emphysema.     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 Right now the plan will be to continue surveillance, follow with Dr Ninetta Lights in a year. We will repeat his CT scan at that time.   COPD (chronic obstructive pulmonary disease) Continue the DuoNebs qid

## 2011-12-03 NOTE — Assessment & Plan Note (Signed)
Continue the DuoNebs qid

## 2011-12-03 NOTE — Patient Instructions (Addendum)
Follow with Dr Ninetta Lights  in 1 year as planned Please continue your DuoNebs 4 times a day Follow with Dr Delton Coombes in 6 months or sooner if you have any trouble.

## 2011-12-03 NOTE — Assessment & Plan Note (Addendum)
Still unclear to me whether this was M atypical disease or TB. Right now the plan will be to continue surveillance, follow with Dr Ninetta Lights in a year. We will repeat his CT scan at that time.

## 2011-12-04 ENCOUNTER — Other Ambulatory Visit: Payer: Self-pay | Admitting: *Deleted

## 2011-12-04 MED ORDER — IPRATROPIUM BROMIDE 0.02 % IN SOLN: 500.0000 ug | Freq: Four times a day (QID) | RESPIRATORY_TRACT | Status: DC

## 2011-12-04 MED ORDER — ALBUTEROL SULFATE (2.5 MG/3ML) 0.083% IN NEBU: 2.5000 mg | INHALATION_SOLUTION | Freq: Four times a day (QID) | RESPIRATORY_TRACT | Status: DC

## 2012-02-12 ENCOUNTER — Ambulatory Visit (INDEPENDENT_AMBULATORY_CARE_PROVIDER_SITE_OTHER): Payer: 59 | Admitting: Internal Medicine

## 2012-02-12 ENCOUNTER — Encounter: Payer: Self-pay | Admitting: Internal Medicine

## 2012-02-12 VITALS — BP 140/100 | HR 64 | Temp 97.4°F | Ht 65.0 in | Wt 143.0 lb

## 2012-02-12 DIAGNOSIS — J449 Chronic obstructive pulmonary disease, unspecified: Secondary | ICD-10-CM

## 2012-02-12 NOTE — Patient Instructions (Addendum)
Continue prilosec 20 mg Take 30-60 min before first meal of the day and then pepcid 20 mg at bedtime   Delsym 2 tsp every 12 hours as needed for cough  Continue the albuterol and atrovent (ipatropium) together 4 x daily  GERD (REFLUX)  is an extremely common cause of respiratory symptoms, many times with no significant heartburn at all.    It can be treated with medication, but also with lifestyle changes including avoidance of late meals, excessive alcohol, smoking cessation, and avoid fatty foods, chocolate, peppermint, colas, red wine, and acidic juices such as orange juice.  NO MINT OR MENTHOL PRODUCTS SO NO COUGH DROPS  USE SUGARLESS CANDY INSTEAD (jolley ranchers or Stover's)  NO OIL BASED VITAMINS - use powdered substitutes.    Follow up Dr Delton Coombes in two weeks, sooner if needed

## 2012-02-12 NOTE — Progress Notes (Signed)
Subjective:    Patient ID: Caleb [name], male    DOB: 09/13/1950, 61 y.o.   MRN: [id number] HPI 61 yo man, smoker (80 pk-yrs), hx of COPD on Symbicort + prn SABA. Also hx allergies and HTN.  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 TB  Spirometry 04/17/11>FEV1 43%, ratio 50   04/17/11 New Consult  Referred for CT scan chest with RUL mass in 04/01/2011. PET scan 04/10/11.  PET scan (04-10-11):The right apical lung nodule demonstrates primarily low level nonmalignant range hypermetabolism. A portion of its peripheral/pleural-based component demonstrates borderline malignant range hypermetabolism. He is feeling well, not coughing. The scan was prompted by URI and bronchitis in November and December. Has lot about 20 lbs over 3 months.  >>ENB (Mass resected, granuloma with question of TB, AFB neg )  07/23/2011 Acute OV  Complains of prod cough with yellow/brown mucus, increased SOB, chills/sweats x4days . Went to UC on 4.20.13 and was given septra ds. Patient presents today complaining that his cough is not any better, and he's having trouble sleeping at night. He complains of thick, yellow-brown mucus, and wheezing. He denies any hemoptysis, weight loss, chest pain, orthopnea, PND, or leg swelling. Is not taking any over-the-counter medications. He says that he was seen at urgent care with a chest x-ray done. Those results and records are not available at today's visit.  Pt was seen in January for lung mass consultation . He was adm to the hospital 05-08-11 and underwent VATS. Path showed a  necrotizing granuloma and AFB positive smear from microbiology.   A PCR done on his VATS specimen was (-) for TB as well. He was started on 4 drug TB rx per ID . He was not able to tolerate these meds with multiple side effects with ER and UC visits due to this.  He has been followed by ID , and is currently awaiting final results from reculture of AFB   ROV 08/22/11 -- 61 yo man, tobacco quit 1/13. 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 He had a short course 4 drug therapy, all of his eval has been PCR negative for TB so far. He has a repeat CT scan ordered for August. He is taking Symbicort but only 1 puff bid, has SABA that he uses rarely. Was treated for AE 4/23 with pred. He is not having fevers, he does have night sweats but not every night. He coughs when he lays flat, better with his head up. He is on omeprazole. He has significant fatigue.  Has SOB with activity.   ROV 12/03/11 -- 61 yo man, tobacco quit 1/13. 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 He had a short course 4 drug therapy, all of his eval has been PCR negative for TB. Has been followed by Dr Ninetta Lights, tried to take meds for TB even w no true cx data, but he was unable to tolerate. He had a repeat CT scan chest on 11/07/11 that shows no new nodules or lesions. Plan for now is to follow in a year, reassess sooner if any new sx. He does have some night sweats, but he has had these for 3 -4 years. He has some SOB with exertion. No exacerbations since last time.   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 C/o sob and chest tightness at rest better p neb performist in office.   No obvious daytime variabilty or   cp or chest tightness, subjective wheeze overt sinus or hb symptoms. No unusual exp hx   At baseline Sleeping ok without nocturnal  or early am exacerbation  of respiratory  c/o's or need for noct saba. Also denies any obvious fluctuation of symptoms with weather or environmental changes  or other aggravating or alleviating factors except as outlined above   ROS  The following are not active complaints unless bolded sore throat, dysphagia, dental problems, itching, sneezing,  nasal congestion or excess/ purulent secretions, ear ache,   fever, chills, sweats, unintended wt loss, pleuritic or exertional cp, hemoptysis,  orthopnea pnd or leg swelling, presyncope, palpitations, heartburn, abdominal pain, anorexia, nausea, vomiting, diarrhea  or change in bowel or urinary habits, change in stools or urine, dysuria,hematuria,  rash, arthralgias, visual complaints, headache, numbness weakness or ataxia or problems with walking or coordination,  change in mood/affect or memory.           Objective:   Physical Exam  Wt Readings from Last 3 Encounters:  02/12/12 143 lb (64.864 kg)  12/03/11 141 lb 9.6 oz (64.229 kg)  11/13/11 141 lb (63.957 kg)    GEN: A/Ox3; pleasant , NAD, well nourished   HEENT:  [place]/AT,  EACs-clear, TMs-wnl, NOSE-clear, THROAT-clear, no lesions, no postnasal drip or exudate noted.   NECK:  Supple w/ fair ROM; no JVD; normal carotid impulses w/o bruits; no thyromegaly or nodules palpated; no lymphadenopathy.  RESP  Distant  exp wheeze .no accessory muscle use, no dullness to percussion  CARD:  RRR, no m/r/g  , no peripheral edema, pulses intact, no cyanosis or clubbing.  Musco: Warm bil, no deformities or joint swelling noted.   Neuro: alert, no focal deficits noted.    Skin: Warm, no lesions or rashes    CT chest 11/07/11 --  Comparison: PET CT from 04/10/2011.  Findings: No supraclavicular or axillary lymph nodes. There is no  enlarged mediastinal or hilar lymph nodes. No pericardial or  pleural effusion.  Postop change involving the right upper lobe is identified  consistent with wedge resection surgery.  No new or enlarging pulmonary nodules or masses identified. Mild  changes of emphysema identified.  Review of the visualized bony structures is  unremarkable. No  worrisome lytic or sclerotic bone lesions identified.  IMPRESSION:  1. Stable CT of the chest. Stable postop change within the right  upper lobe without evidence for recurrence of nodules/mass  2. Mild emphysema.     Assessment & Plan:

## 2012-02-14 NOTE — Assessment & Plan Note (Signed)
DDX of  difficult airways managment all start with A and  include Adherence, Ace Inhibitors, Acid Reflux, Active Sinus Disease, Alpha 1 Antitripsin deficiency, Anxiety masquerading as Airways dz,  ABPA,  allergy(esp in young), Aspiration (esp in elderly), Adverse effects of DPI,  Active smokers, plus two Bs  = Bronchiectasis and Beta blocker use..and one C= CHF  Adherence is always the initial "prime suspect" and is a multilayered concern that requires a "trust but verify" approach in every patient - starting with knowing how to use medications, especially inhalers, correctly, keeping up with refills and understanding the fundamental difference between maintenance and prns vs those medications only taken for a very short course and then stopped and not refilled.   The problem here is that he's using duoneb as a maintenance and doesn't have the insight to understand he can't just stop a maint drug and expect to do just as well - plus he has no feasible/working backup as hfa albuterol is much weaker than the maint neb. Would strongly consider changing maint rx but will defer this to Dr Delton Coombes on return  ? Acid reflux contributing to symptoms > hard to tell, but in the absence of purulent sputum and assoc with sore throat it could be > rec max rx plus approp diet until return.

## 2012-02-24 ENCOUNTER — Ambulatory Visit (INDEPENDENT_AMBULATORY_CARE_PROVIDER_SITE_OTHER): Payer: 59 | Admitting: Emergency Medicine

## 2012-02-24 ENCOUNTER — Encounter: Payer: Self-pay | Admitting: Emergency Medicine

## 2012-02-24 VITALS — BP 100/60 | HR 78 | Temp 97.9°F | Ht 66.0 in | Wt 146.8 lb

## 2012-02-24 DIAGNOSIS — R222 Localized swelling, mass and lump, trunk: Secondary | ICD-10-CM

## 2012-02-24 DIAGNOSIS — J449 Chronic obstructive pulmonary disease, unspecified: Secondary | ICD-10-CM

## 2012-02-24 DIAGNOSIS — J4489 Other specified chronic obstructive pulmonary disease: Secondary | ICD-10-CM

## 2012-02-24 NOTE — Progress Notes (Signed)
Subjective:    Patient ID: Caleb [name], male    DOB: 1950/05/18, 61 y.o.   MRN: [id number] HPI 61 yo man, smoker (80 pk-yrs), hx of COPD on Symbicort + prn SABA. Also hx allergies and HTN.  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 TB  Spirometry 04/17/11>FEV1 43%, ratio 50   04/17/11 New Consult  Referred for CT scan chest with RUL mass in 04/01/2011. PET scan 04/10/11.  PET scan (04-10-11):The right apical lung nodule demonstrates primarily low level nonmalignant range hypermetabolism. A portion of its peripheral/pleural-based component demonstrates borderline malignant range hypermetabolism. He is feeling well, not coughing. The scan was prompted by URI and bronchitis in November and December. Has lot about 20 lbs over 3 months.  >>ENB (Mass resected, granuloma with question of TB, AFB neg )  07/23/2011 Acute OV  Complains of prod cough with yellow/brown mucus, increased SOB, chills/sweats x4days . Went to UC on 4.20.13 and was given septra ds. Patient presents today complaining that his cough is not any better, and he's having trouble sleeping at night. He complains of thick, yellow-brown mucus, and wheezing. He denies any hemoptysis, weight loss, chest pain, orthopnea, PND, or leg swelling. Is not taking any over-the-counter medications. He says that he was seen at urgent care with a chest x-ray done. Those results and records are not available at today's visit.  Pt was seen in January for lung mass consultation . He was adm to the hospital 05-08-11 and underwent VATS. Path showed a  necrotizing granuloma and AFB positive smear from microbiology.   A PCR done on his VATS specimen was (-) for TB as well. He was started on 4 drug TB rx per ID . He was not able to tolerate these meds with multiple side effects with ER and UC visits due to this.  He has been followed by ID , and is currently awaiting final results from reculture of AFB   ROV 08/22/11 -- 61 yo man, tobacco quit 1/13. 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 He had a short course 4 drug therapy, all of his eval has been PCR negative for TB so far. He has a repeat CT scan ordered for August. He is taking Symbicort but only 1 puff bid, has SABA that he uses rarely. Was treated for AE 4/23 with pred. He is not having fevers, he does have night sweats but not every night. He coughs when he lays flat, better with his head up. He is on omeprazole. He has significant fatigue.  Has SOB with activity.   ROV 12/03/11 -- 61 yo man, tobacco quit 1/13. 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 He had a short course 4 drug therapy, all of his eval has been PCR negative for TB. Has been followed by Dr Ninetta Lights, tried to take meds for TB even w no true cx data, but he was unable to tolerate. He had a repeat CT scan chest on 11/07/11 that shows no new nodules or lesions. Plan for now is to follow in a year, reassess sooner if any new sx. He does have some night sweats, but he has had these for 3 -4 years. He has some SOB with exertion. No exacerbations since last time.   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 C/o sob and chest tightness at rest better p neb performist in office.   ROV 02/24/12 -- ROV 12/03/11 -- 61 yo man, tobacco quit 1/13. 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 Reassuring CT scan chest on 11/07/11. Not on rx, planning for repeat scan in 1 yr. Was seen as above and treated for possible UA irritation due to GERD, URI. He is improved, still  with some scratchy throat, nasal drainage.      Objective:   Physical Exam  Wt Readings from Last 3 Encounters:  02/24/12 146 lb 12.8 oz (66.588 kg)  02/12/12 143 lb (64.864 kg)  12/03/11 141 lb 9.6 oz (64.229 kg)    GEN: A/Ox3; pleasant , NAD, well nourished   HEENT:  [place]/AT,  EACs-clear, TMs-wnl, NOSE-clear, THROAT-clear, no lesions, no postnasal drip or exudate noted.   NECK:  Supple w/ fair ROM; no JVD; normal carotid impulses w/o bruits; no thyromegaly or nodules palpated; no lymphadenopathy.  RESP  Distant  exp wheeze .no accessory muscle use, no dullness to percussion  CARD:  RRR, no m/r/g  , no peripheral edema, pulses intact, no cyanosis or clubbing.  Musco: Warm bil, no deformities or joint swelling noted.   Neuro: alert, no focal deficits noted.    Skin: Warm, no lesions or rashes    CT chest 11/07/11 --  Comparison: PET CT from 04/10/2011.  Findings: No supraclavicular or axillary lymph nodes. There is no  enlarged mediastinal or hilar lymph nodes. No pericardial or  pleural effusion.  Postop change involving the right upper lobe is identified  consistent with wedge resection surgery.  No new or enlarging pulmonary nodules or masses identified. Mild  changes of emphysema identified.  Review of the visualized bony structures is unremarkable. No  worrisome lytic or sclerotic bone lesions identified.  IMPRESSION:  1. Stable CT of the chest. Stable postop change within the right  upper lobe without evidence for recurrence of nodules/mass  2. Mild emphysema.     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

## 2012-02-24 NOTE — Patient Instructions (Addendum)
Please continue your nebulized medications 4 x a day Follow with Dr Delton Coombes in May 2014 or sooner if you have any problems.

## 2012-02-24 NOTE — Assessment & Plan Note (Signed)
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

## 2012-02-24 NOTE — Assessment & Plan Note (Signed)
continue duonebs qid

## 2012-03-03 ENCOUNTER — Telehealth: Payer: Self-pay | Admitting: Emergency Medicine

## 2012-03-03 NOTE — Telephone Encounter (Signed)
Thank You.

## 2012-03-03 NOTE — Telephone Encounter (Signed)
I spoke with pt and he stated he went to UC over the weekend. He was giving ABX and ear drops. He is still coughing and slight wheezing. Does not seem to be helping. He is scheduled to see RA on 03/04/12 at 9:30 for acute visit . Will forward to RB so he is aware of this.

## 2012-03-04 ENCOUNTER — Ambulatory Visit (INDEPENDENT_AMBULATORY_CARE_PROVIDER_SITE_OTHER)
Admission: RE | Admit: 2012-03-04 | Discharge: 2012-03-04 | Disposition: A | Discharge status: Home / Self Care | Payer: 59 | Source: Ambulatory Visit | Attending: Pulmonary Disease | Admitting: Pulmonary Disease

## 2012-03-04 ENCOUNTER — Encounter: Payer: Self-pay | Admitting: Pulmonary Disease

## 2012-03-04 ENCOUNTER — Ambulatory Visit (INDEPENDENT_AMBULATORY_CARE_PROVIDER_SITE_OTHER): Payer: 59 | Admitting: Pulmonary Disease

## 2012-03-04 ENCOUNTER — Other Ambulatory Visit (INDEPENDENT_AMBULATORY_CARE_PROVIDER_SITE_OTHER): Payer: 59

## 2012-03-04 VITALS — BP 130/82 | HR 90 | Temp 97.1°F | Ht 64.0 in | Wt 140.8 lb

## 2012-03-04 DIAGNOSIS — R05 Cough: Secondary | ICD-10-CM

## 2012-03-04 DIAGNOSIS — A319 Mycobacterial infection, unspecified: Secondary | ICD-10-CM

## 2012-03-04 DIAGNOSIS — J449 Chronic obstructive pulmonary disease, unspecified: Secondary | ICD-10-CM

## 2012-03-04 DIAGNOSIS — R059 Cough, unspecified: Secondary | ICD-10-CM

## 2012-03-04 LAB — COMPREHENSIVE METABOLIC PANEL
Albumin: 4 g/dL (ref 3.5–5.2)
Alkaline Phosphatase: 64 U/L (ref 39–117)
BUN: 15 mg/dL (ref 6–23)
CO2: 27 mEq/L (ref 19–32)
GFR: 70.43 mL/min (ref >60.00)
Glucose, Bld: 95 mg/dL (ref 70–99)
Potassium: 3.9 mEq/L (ref 3.5–5.1)
Total Protein: 7.9 g/dL (ref 6.0–8.3)

## 2012-03-04 LAB — CBC WITH DIFFERENTIAL/PLATELET
Basophils Relative: 1.1 % (ref 0.0–3.0)
Eosinophils Relative: 0.8 % (ref 0.0–5.0)
HCT: 42 % (ref 39.0–52.0)
Lymphs Abs: 1.5 10*3/uL (ref 0.7–4.0)
MCV: 93.1 fl (ref 78.0–100.0)
Monocytes Absolute: 1.1 10*3/uL — ABNORMAL HIGH (ref 0.1–1.0)
Monocytes Relative: 10 % (ref 3.0–12.0)
Neutrophils Relative %: 73.4 % (ref 43.0–77.0)
RBC: 4.51 Mil/uL (ref 4.22–5.81)
WBC: 10.5 10*3/uL (ref 4.5–10.5)

## 2012-03-04 MED ORDER — PREDNISONE 10 MG PO TABS: ORAL_TABLET | ORAL | Status: DC

## 2012-03-04 MED ORDER — BUDESONIDE-FORMOTEROL FUMARATE 160-4.5 MCG/ACT IN AERO: 2.0000 | INHALATION_SPRAY | Freq: Two times a day (BID) | RESPIRATORY_TRACT | Status: DC

## 2012-03-04 NOTE — Assessment & Plan Note (Signed)
Add symbicort

## 2012-03-04 NOTE — Progress Notes (Signed)
  Subjective:    Patient ID: Caleb [name], male    DOB: May 13, 1950, 61 y.o.   MRN: [id number]  HPI  PCP - Elwyn Reach  61 yo man, smoker (80 pk-yrs), COPD. Also hx allergies and HTN.  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  03/04/2012 3 rd visit in 1 month Saw pcp 10/13 - sent to [place] ed by ambulance pred taper, nausea med Saw Wert 02/12/12 then again 02/24/12 (RB) Pt reports ear pain prod cough w yellow mucus,f/c/s remain-- went to urgent care x3 days ago w same symptoms -given cefdinir, flu test neg He was seen in ID clinic and started on  He took Rif/Azithro (did not get [facility]) and quickly developed a rash. He stopped his meds on June 10 (had been on less than 1 week). CXr clear Labs- WC nml, Na 134 He is just not feeling well, no obvious risk factors for PE Cough & ear pain seem to be main complaints today    Past Medical History  Diagnosis Date  . Hypertension   . Emphysema   . Hyperlipidemia   . Allergic rhinitis   . Coronary artery disease     sm blockages on 2007 cath  . COPD (chronic obstructive pulmonary disease)     emyphesema  also  . Shortness of breath     DOE  . Recurrent upper respiratory infection (URI)     NOV  & DECEMBER 2012  . GERD (gastroesophageal reflux disease)   . Headache   . HOH (hard of hearing)     BOTH.....Marland KitchenLEFT IS BETTER THEN RIGHT      Review of Systems neg for any significant sore throat, dysphagia, itching, sneezing, nasal congestion or excess/ purulent secretions, fever, chills, sweats, unintended wt loss, pleuritic or exertional cp, hempoptysis, orthopnea pnd or change in chronic leg swelling. Also denies presyncope, palpitations, heartburn, abdominal pain, nausea, vomiting, diarrhea or change in bowel or urinary habits, dysuria,hematuria, rash, arthralgias, visual complaints, headache, numbness weakness or ataxia.      Objective:   Physical Exam  Gen. Pleasant, well-nourished, in no distress ENT - no lesions, no post nasal drip Neck: No JVD, no thyromegaly, no carotid bruits Lungs: no use of accessory muscles, no dullness to percussion, decreased without rales or rhonchi  Cardiovascular: Rhythm regular, heart sounds  normal, no murmurs or gallops, no peripheral edema Musculoskeletal: No deformities, no cyanosis or clubbing         Assessment & Plan:

## 2012-03-04 NOTE — Assessment & Plan Note (Addendum)
Unclear if his persistent symptoms represent slow to resolve copd flare vs worsening of mycobacterial disease - absence of infiltrates on cxr is reassuring.  Blood work - CBC, CMET Sputum culture & sputum for afb CXR today - we will decide about CT scan  Prednisone 10 mg tabs  Take 2 tabs daily with food x 5ds, then 1 tab daily with food x 5ds then STOP Start symbicort 160 - 2 ppuffs twice daily  Sample Stay on nebs Complete antibiotic & ear drops Call us back in 10 ds to report - if persistent symptoms, may have to rescan

## 2012-03-04 NOTE — Patient Instructions (Addendum)
Blood work - CBC, CMET Sputum culture & sputum for afb CXR today - we will decide about CT scan  Prednisone 10 mg tabs  Take 2 tabs daily with food x 5ds, then 1 tab daily with food x 5ds then STOP Start symbicort 160 - 2 ppuffs twice daily  Sample Stay on nebs Complete antibiotic & ear drops Call us back in 10 ds to report Obtain CXR report from [place]

## 2012-03-06 ENCOUNTER — Encounter: Payer: Self-pay | Admitting: Adult Health

## 2012-03-06 ENCOUNTER — Telehealth: Payer: Self-pay | Admitting: Emergency Medicine

## 2012-03-06 ENCOUNTER — Ambulatory Visit (INDEPENDENT_AMBULATORY_CARE_PROVIDER_SITE_OTHER): Payer: 59 | Admitting: Adult Health

## 2012-03-06 VITALS — BP 128/84 | HR 74 | Temp 98.6°F | Ht 64.0 in | Wt 142.8 lb

## 2012-03-06 DIAGNOSIS — J449 Chronic obstructive pulmonary disease, unspecified: Secondary | ICD-10-CM

## 2012-03-06 DIAGNOSIS — R222 Localized swelling, mass and lump, trunk: Secondary | ICD-10-CM

## 2012-03-06 DIAGNOSIS — J4489 Other specified chronic obstructive pulmonary disease: Secondary | ICD-10-CM

## 2012-03-06 NOTE — Progress Notes (Signed)
Subjective:    Patient ID: Caleb [name], male    DOB: 1950-12-21, 61 y.o.   MRN: [id number]  HPI   PCP - Elwyn Reach  61 yo man, smoker (80 pk-yrs), COPD. Also hx allergies and HTN.  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  03/04/2012 3 rd visit in 1 month Saw pcp 10/13 - sent to [place] ed by ambulance pred taper, nausea med Saw Wert 02/12/12 then again 02/24/12 (RB) Pt reports ear pain prod cough w yellow mucus,f/c/s remain-- went to urgent care x3 days ago w same symptoms -given cefdinir, flu test neg He was seen in ID clinic and started on  He took Rif/Azithro (did not get [facility]) and quickly developed a rash. He stopped his meds on June 10 (had been on less than 1 week). CXr clear Labs- WC nml, Na 134 He is just not feeling well, no obvious risk factors for PE Cough & ear pain seem to be main complaints today  03/06/2012  Returns for persistent symptoms of cough . Had  hemoptysis onset this AM w/ some BRB, mixed w/ yellow.  no increased cough or increased tightness since onset.  worried this may be related to recalled albuterol neb solution  Seen 2 days ago, CXR -NAD . CBC unremarkbable  Sputum cx pending , prelim afb neg.  No chest pain or significant dyspnea.  Cough is quite severe. Using otc robitussium without much help.  Has few days left of abx and steroids .  Last CT chest 10/2011 without reoccurrence of RUL lung mass.      Past Medical History  Diagnosis Date  . Hypertension   . Emphysema   . Hyperlipidemia   . Allergic rhinitis   . Coronary artery disease     sm blockages on 2007 cath  . COPD (chronic obstructive pulmonary disease)     emyphesema  also  . Shortness of breath     DOE  . Recurrent upper respiratory infection (URI)     NOV  & DECEMBER 2012  . GERD (gastroesophageal reflux disease)   . Headache   . HOH (hard of hearing)    BOTH.....Marland KitchenLEFT IS BETTER THEN RIGHT      Review of Systems Constitutional:   No  weight loss, night sweats,  Fevers, chills,  +fatigue, or  lassitude.  HEENT:   No headaches,  Difficulty swallowing,  Tooth/dental problems, or  Sore throat,                No sneezing, itching, ear ache, nasal congestion, post nasal drip,   CV:  No chest pain,  Orthopnea, PND, swelling in lower extremities, anasarca, dizziness, palpitations, syncope.   GI  No heartburn, indigestion, abdominal pain, nausea, vomiting, diarrhea, change in bowel habits, loss of appetite, bloody stools.   Resp:       No chest wall deformity  Skin: no rash or lesions.  GU: no dysuria, change in color of urine, no urgency or frequency.  No flank pain, no hematuria   MS:  No joint pain or swelling.  No decreased range of motion.  No back pain.  Psych:  No change in mood or affect. No depression or anxiety.  No memory loss.         Objective:   Physical Exam   Gen. Pleasant, thin , in no distress ENT - no lesions, no post nasal drip  Neck: No JVD, no thyromegaly, no carotid bruits Lungs: no use of accessory muscles, no dullness to percussion, decreased without rales or rhonchi  Cardiovascular: Rhythm regular, heart sounds  normal, no murmurs or gallops, no peripheral edema Musculoskeletal: No deformities, no cyanosis or clubbing         Assessment & Plan:

## 2012-03-06 NOTE — Telephone Encounter (Signed)
Spoke with patient, patient reports that he woke up this morning spitting up dark red blood several times.  Patient states that not every time he coughs up sputem is has blood but that times that he does it is about the size of a penny.  Patient denies any f/c/s or SOB.  Patient has been scheduled to see Tammy today at 3pm. Will also forward to RB as FYI   Per Dr. Vassie Loll last OV 03/04/12 Patient Instructions     Blood work - CBC, CMET  Sputum culture & sputum for afb  CXR today - we will decide about CT scan  Prednisone 10 mg tabs Take 2 tabs daily with food x 5ds, then 1 tab daily with food x 5ds then STOP  Start symbicort 160 - 2 ppuffs twice daily Sample  Stay on nebs  Complete antibiotic & ear drops  Call us back in 10 ds to report  Obtain CXR report from [place]      Dr. Vassie Loll 03/04/12

## 2012-03-06 NOTE — Addendum Note (Signed)
Addended by: Boone Master E on: 03/06/2012 04:13 PM   Modules accepted: Orders

## 2012-03-06 NOTE — Assessment & Plan Note (Signed)
Slow to resolve COPD exacerbation with hx of Right lung mass  S/p resection and MAC  Advised to finish abx and steroids  Check CT chest  follow up Dr. Delton Coombes  In 2 weeks  Add mucinex and delsym

## 2012-03-06 NOTE — Patient Instructions (Addendum)
[employer] and Steroids as directed  Mucinex Twice daily  As needed  Cough/congestion  Delsym 2 tsp Twice daily  As needed  Cough We are setting you up for CT chest  Please contact office for sooner follow up if symptoms do not improve or worsen or seek emergency care  follow up Dr. Delton Coombes  In 2 weeks as planned and As needed

## 2012-03-08 LAB — RESPIRATORY CULTURE OR RESPIRATORY AND SPUTUM CULTURE

## 2012-03-10 ENCOUNTER — Other Ambulatory Visit: Payer: Self-pay | Admitting: Adult Health

## 2012-03-10 ENCOUNTER — Inpatient Hospital Stay: Admission: RE | Admit: 2012-03-10 | Payer: 59 | Source: Ambulatory Visit

## 2012-03-10 DIAGNOSIS — R222 Localized swelling, mass and lump, trunk: Secondary | ICD-10-CM

## 2012-03-10 NOTE — Telephone Encounter (Signed)
Thank you :)

## 2012-03-11 ENCOUNTER — Ambulatory Visit (HOSPITAL_COMMUNITY)
Admission: RE | Admit: 2012-03-11 | Discharge: 2012-03-11 | Disposition: A | Discharge status: Home / Self Care | Payer: 59 | Source: Ambulatory Visit | Attending: Adult Health | Admitting: Adult Health

## 2012-03-11 ENCOUNTER — Other Ambulatory Visit: Payer: 59

## 2012-03-11 DIAGNOSIS — C001 Malignant neoplasm of external lower lip: Secondary | ICD-10-CM | POA: Insufficient documentation

## 2012-03-11 DIAGNOSIS — I10 Essential (primary) hypertension: Secondary | ICD-10-CM | POA: Insufficient documentation

## 2012-03-11 DIAGNOSIS — R0602 Shortness of breath: Secondary | ICD-10-CM | POA: Insufficient documentation

## 2012-03-11 DIAGNOSIS — R222 Localized swelling, mass and lump, trunk: Secondary | ICD-10-CM

## 2012-03-11 DIAGNOSIS — R911 Solitary pulmonary nodule: Secondary | ICD-10-CM | POA: Insufficient documentation

## 2012-03-11 DIAGNOSIS — R05 Cough: Secondary | ICD-10-CM | POA: Insufficient documentation

## 2012-03-11 DIAGNOSIS — R042 Hemoptysis: Secondary | ICD-10-CM | POA: Insufficient documentation

## 2012-03-11 DIAGNOSIS — R059 Cough, unspecified: Secondary | ICD-10-CM | POA: Insufficient documentation

## 2012-03-11 MED ORDER — IOHEXOL 350 MG/ML SOLN
100.0000 mL | Freq: Once | INTRAVENOUS | Status: AC | PRN
  Administered 2012-03-11: 100 mL via INTRAVENOUS

## 2012-03-12 ENCOUNTER — Encounter: Payer: Self-pay | Admitting: Adult Health

## 2012-03-12 DIAGNOSIS — R9389 Abnormal findings on diagnostic imaging of other specified body structures: Secondary | ICD-10-CM | POA: Insufficient documentation

## 2012-03-12 NOTE — Progress Notes (Signed)
Quick Note:  ATC x1 > line rang multiple times w/ NA. WCB. ______

## 2012-03-13 ENCOUNTER — Telehealth: Payer: Self-pay | Admitting: Emergency Medicine

## 2012-03-13 NOTE — Telephone Encounter (Signed)
ATC patient line busy x2    Notes Recorded by Julio Sicks, NP on 03/12/2012 at 3:54 PM Advise that CT Chest is neg for PE  No reoccurence of RUL nodularity.  New LLL macro nodules -? Infection - needs to finish abx  follow up Dr. Delton Coombes In 2 weeks to discuss in detail   ? Area on liver/fatty liver will need to fax to PCP  Make ov with PCP to review and decide if further testing is indicated

## 2012-03-16 NOTE — Telephone Encounter (Signed)
Pt and spouse are aware of CT results. Pt states he finished the abx 1 day prior to the scan and finished the Prednisone the day of the scan. He says he still has increased DOE and cough is prod with scant yellow sputum. He denies any fever, chest tightness or wheezing. Pt and spouse question if the pt should go back to ID for follow-up or wait to see RB on 03/23/12. Pt would also like a copy of the scan sent to Dr. Epifanio Lesches at [facility] in [place]. TP, pls advise. Allergies  Allergen Reactions  . Omnipaque (Iohexol)     Hives,itching, 13 hr prep  . Avelox (Moxifloxacin Hcl In Nacl) Rash  . Codeine Rash

## 2012-03-16 NOTE — Telephone Encounter (Signed)
That is fine may see ID again.  Keep follow up with Dr. Delton Coombes   May send notes, images  Please contact office for sooner follow up if symptoms do not improve or worsen or seek emergency care

## 2012-03-16 NOTE — Telephone Encounter (Signed)
I think you sent this to the wrong md.

## 2012-03-16 NOTE — Telephone Encounter (Signed)
Pt's spouse aware of TP recs. They will call ID to get an appt because the pt was to follow-up there anyway. They will call if having any problems with this.

## 2012-03-17 ENCOUNTER — Ambulatory Visit (HOSPITAL_COMMUNITY)
Admission: RE | Admit: 2012-03-17 | Discharge: 2012-03-17 | Disposition: A | Discharge status: Home / Self Care | Payer: 59 | Source: Ambulatory Visit | Attending: Infectious Diseases | Admitting: Infectious Diseases

## 2012-03-17 ENCOUNTER — Other Ambulatory Visit: Payer: Self-pay

## 2012-03-17 ENCOUNTER — Encounter: Payer: Self-pay | Admitting: Infectious Diseases

## 2012-03-17 ENCOUNTER — Ambulatory Visit (INDEPENDENT_AMBULATORY_CARE_PROVIDER_SITE_OTHER): Payer: 59 | Admitting: Infectious Diseases

## 2012-03-17 VITALS — BP 138/88 | HR 86 | Temp 98.3°F | Ht 66.0 in | Wt 153.0 lb

## 2012-03-17 DIAGNOSIS — A319 Mycobacterial infection, unspecified: Secondary | ICD-10-CM | POA: Insufficient documentation

## 2012-03-17 DIAGNOSIS — J449 Chronic obstructive pulmonary disease, unspecified: Secondary | ICD-10-CM | POA: Insufficient documentation

## 2012-03-17 DIAGNOSIS — I2510 Atherosclerotic heart disease of native coronary artery without angina pectoris: Secondary | ICD-10-CM | POA: Insufficient documentation

## 2012-03-17 DIAGNOSIS — J4489 Other specified chronic obstructive pulmonary disease: Secondary | ICD-10-CM | POA: Insufficient documentation

## 2012-03-17 DIAGNOSIS — E785 Hyperlipidemia, unspecified: Secondary | ICD-10-CM | POA: Insufficient documentation

## 2012-03-17 DIAGNOSIS — I10 Essential (primary) hypertension: Secondary | ICD-10-CM | POA: Insufficient documentation

## 2012-03-17 MED ORDER — AZITHROMYCIN 500 MG PO TABS: 600.0000 mg | ORAL_TABLET | ORAL | Status: DC

## 2012-03-17 NOTE — Assessment & Plan Note (Addendum)
We attempted to treat him previously and he developed rash. My great appreciation to my partner Dr Orvan Falconer for seeing him this summer. He was felt to have rash due to rifampin (was on this and azithromycin, never got the [facility]).  Will start him back on the azithro (500mg  MWF) and see if he can tolerate it. Speak to [employer] in the intervening period to see what other good options may be. Hopefully not IV therapy/amikacin. He will be eval at [facility] for his liver lesion, would like to get 2nd opinion at [facility] pulmonary as well.   Given his cx showed Strep would question if this is the cause of his new lung lesions. This would seem unlikely cause of a chronic infection, and I would expect him to be much sicker.  Will again contact [employer]. See him back in 2-3 weeks.

## 2012-03-17 NOTE — Assessment & Plan Note (Addendum)
Will check his ECG (NSR 77-78, septal infarct ? Age). Have him seen ASAP, pt wishes to be seen by Dr Excell Seltzer (03-24-12 8:50AM). He is advised to go to ED if worsening pain.

## 2012-03-17 NOTE — Progress Notes (Signed)
Subjective:    Patient ID: Caleb [name], male    DOB: 12/31/1950, 61 y.o.   MRN: [id number]  HPI 61 yo M with hx of HTN, COPD, hyperlipidemia, prev cardiac cath/angina. States he had CXR (-) July 2011. In November 2012 had URI. Took course of anbx and this improved but then recurred in December. He was seen and asked for CXR as he was having chest pain in his R upper chest. This was found to show a mass. He underwent a CT scan And then a PET scan (04-10-11):The right apical lung nodule demonstrates primarily low level nonmalignant range hypermetabolism. A portion of its peripheral/pleural-based component demonstrates borderline malignant range hypermetabolism. 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 His serum quantiferon gold was (-). A PCR done on his VATS specimen was (-) for TB as well. He was started on 4 drug TB rx and sent home 05-14-11. He followed up with his health dept and continued on 4 drug rx until 05-23-11. He developed a rash at that time. His rash improved with benadryl initially. He then went to ED on 2-22 and was given steroid injection and rash resolved.  His AFB Cx has had minimal growth. Has had this probed and was TB (-).  His only foreign travel was [place] 10 years ago.  I spoke with Health Dept and [facility] with advice to start him on rifampin 600mg  daily, ETH 20/kg/daily, azithro 500mg  MWF and 250mg  T/TH. Treatment to last 1 year. He was seen in ID and started on these medicines. He took Rif/Azithro (did not get [facility]) and quickly developed a rash. He stopped his meds on June 10 (had been on less than 1 week).  He underwent repeat CT scan (11-07-11)- 1. Stable CT of the chest. Stable postop change within the right upper lobe without evidence for recurrence of  nodules/mass. 2. Mild emphysema.  Had sputum sample sent 12-4 which grew S pnumoniae (I- PEN, Ceftriaxone, S-LVQ). Was seen in Pulm clinic for f/u on 03-06-12 after 2 episodes of worsening cough, SOB, hypoxia, fever. Was treated with steroids, anbx and albuterol nebs. He underwent repeat CT scan (03-11-12) showing: "No evidence of pulmonary embolism. Peribronchovascular nodularity in the left lower lobe new since previous exam question infection such as atypical mycobacterial process. Larger nonspecific 7 mm and 12 x 15 mm nodular foci left lower lobe could represent more focal confluent or granulomatous process; however these require follow-up CT evaluation in 4-6 months to ensure resolution in order to exclude tumor. Area of inhomogeneous enhancement within liver 2.4 x 1.7 cm, new. This could represent an area of focal sparing in a background of fatty infiltration though a subtle hypervascular mass is not excluded. "  Feels like his cough has been better over the last 24h than it has been over the last month. No fever or chills. Has coordinated with PCP for w/u of the liver lesion. Has some concern that he has gotten "glass dust" from his contaminated albuterol that he got from [place].     Review of Systems  Constitutional: Negative for fever, chills, appetite change and unexpected weight change.  Respiratory: Positive for cough and shortness of breath.   Cardiovascular: Positive for chest pain. Negative for leg swelling.       Has had 2 episodes  in last week. Last was 2 days ago. Stabbing pain, awakens him from sleep. Relieved with drink of water or letting ASA dissolve in his mouth. Has nightly sweats. Radiates into his jaws.   Gastrointestinal: Negative for diarrhea and constipation.  Genitourinary: Positive for urgency. Negative for difficulty urinating.       Objective:   Physical Exam  Constitutional: He appears well-developed and well-nourished.  HENT:  Mouth/Throat: Oropharyngeal  exudate present.  Eyes: EOM are normal. Pupils are equal, round, and reactive to light.  Neck: Neck supple.  Cardiovascular: Normal rate, regular rhythm and normal heart sounds.   Pulmonary/Chest: Effort normal and breath sounds normal.  Abdominal: Soft. Bowel sounds are normal. There is no tenderness. There is no rebound.  Musculoskeletal: He exhibits no edema.  Lymphadenopathy:    He has no cervical adenopathy.    He has no axillary adenopathy.          Assessment & Plan:

## 2012-03-19 NOTE — Progress Notes (Signed)
Quick Note:  Pt and spouse aware of CT results per 12.13.13 phone note. Pt has upcoming ov w/ RB 12.23.13. ______

## 2012-03-23 ENCOUNTER — Encounter: Payer: Self-pay | Admitting: Emergency Medicine

## 2012-03-23 ENCOUNTER — Ambulatory Visit (INDEPENDENT_AMBULATORY_CARE_PROVIDER_SITE_OTHER): Payer: 59 | Admitting: Emergency Medicine

## 2012-03-23 ENCOUNTER — Ambulatory Visit: Payer: 59 | Admitting: Emergency Medicine

## 2012-03-23 VITALS — BP 138/82 | HR 75 | Temp 98.1°F | Ht 66.0 in | Wt 148.8 lb

## 2012-03-23 DIAGNOSIS — A319 Mycobacterial infection, unspecified: Secondary | ICD-10-CM

## 2012-03-23 DIAGNOSIS — J449 Chronic obstructive pulmonary disease, unspecified: Secondary | ICD-10-CM

## 2012-03-23 NOTE — Assessment & Plan Note (Signed)
-   will trial stopping Symbicort, especially since we believe he has active infxn.

## 2012-03-23 NOTE — Progress Notes (Signed)
Subjective:    Patient ID: Caleb [name], male    DOB: September 11, 1950, 61 y.o.   MRN: [id number]  ROV 12/03/11 -- 61 yo man, tobacco quit 1/13. 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 He had a short course 4 drug therapy, all of his eval has been PCR negative for TB. Has been followed by Dr Ninetta Lights, tried to take meds for TB even w no true cx data, but he was unable to tolerate. He had a repeat CT scan chest on 11/07/11 that shows no new nodules or lesions. Plan for now is to follow in a year, reassess sooner if any new sx. He does have some night sweats, but he has had these for 3 -4 years. He has some SOB with exertion. No exacerbations since last time.  03/06/2012  Returns for persistent symptoms of cough . Had  hemoptysis onset this AM w/ some BRB, mixed w/ yellow. no increased cough or increased tightness since onset. worried this may be related to recalled albuterol neb solution  Seen 2 days ago, CXR -NAD . CBC unremarkbable  Sputum cx pending , prelim afb neg.  No chest pain or significant dyspnea.  Cough is quite severe. Using otc robitussium without much help.  Has few days left of abx and steroids .  Last CT chest 10/2011 without reoccurrence of RUL lung mass   ROV 03/23/12 -- hx COPD, RUL granuloma with AFB. CT scan with evidence for progression of mycobacterial disease as below. Just started on azithro 500mg  MWF, with consideration to made for additional therapy. He has been unable to tolerate the meds in the past due to rash. Seems to be doing ok on the azithro so far (only has taken 3 doses). His hemoptysis has stopped. He restarted symbicort at last OV with TP    CT chest 03/19/12 --  IMPRESSION:  No evidence of pulmonary embolism.  Peribronchovascular nodularity in the left lower lobe new since  previous exam question infection such as atypical mycobacterial  process.  Larger nonspecific 7 mm and 12 x 15 mm nodular foci left lower lobe   could represent more focal confluent or granulomatous process;  however these require follow-up CT evaluation in 4-6 months to  ensure resolution in order to exclude tumor.  Area of inhomogeneous enhancement within liver 2.4 x 1.7 cm, new.  This could represent an area of focal sparing in a background of  fatty infiltration though a subtle hypervascular mass is not  excluded.  Further evaluation by MR imaging with and without contrast  recommended to exclude mass hepatic lesion     Past Medical History  Diagnosis Date  . Hypertension   . Emphysema   . Hyperlipidemia   . Allergic rhinitis   . Coronary artery disease     sm blockages on 2007 cath  . COPD (chronic obstructive pulmonary disease)     emyphesema  also  . Shortness of breath     DOE  . Recurrent upper respiratory infection (URI)     NOV  & DECEMBER 2012  . GERD (gastroesophageal reflux disease)   . Headache   . HOH (hard of hearing)     BOTH.....Marland KitchenLEFT IS BETTER THEN RIGHT      Review of Systems Constitutional:   No  weight loss, night sweats,  Fevers, chills,  +fatigue, or  lassitude.  HEENT:   No headaches,  Difficulty swallowing,  Tooth/dental problems, or  Sore throat,  No sneezing, itching, ear ache, nasal congestion, post nasal drip,   CV:  No chest pain,  Orthopnea, PND, swelling in lower extremities, anasarca, dizziness, palpitations, syncope.   GI  No heartburn, indigestion, abdominal pain, nausea, vomiting, diarrhea, change in bowel habits, loss of appetite, bloody stools.   Resp:       No chest wall deformity  Skin: no rash or lesions.  GU: no dysuria, change in color of urine, no urgency or frequency.  No flank pain, no hematuria   MS:  No joint pain or swelling.  No decreased range of motion.  No back pain.  Psych:  No change in mood or affect. No depression or anxiety.  No memory loss.      Objective:   Physical Exam Filed Vitals:   03/23/12 1545  BP: 138/82  Pulse:  75  Temp: 98.1 F (36.7 C)   Gen. Pleasant, thin , in no distress ENT - no lesions, no post nasal drip Neck: No JVD, no thyromegaly, no carotid bruits Lungs: no use of accessory muscles, no dullness to percussion, decreased without rales or rhonchi  Cardiovascular: Rhythm regular, heart sounds  normal, no murmurs or gallops, no peripheral edema Musculoskeletal: No deformities, no cyanosis or clubbing       Assessment & Plan:  Mycobacterial disease CT scan consistent with [facility], and he has been restarted on azithro. Hopefully he will tolerate, but he has not in the past. He may need an alternative regimen, maybe an IV regimen if this fails.  - abx as directed by Dr Ninetta Lights.   COPD (chronic obstructive pulmonary disease) - will trial stopping Symbicort, especially since we believe he has active infxn.

## 2012-03-23 NOTE — Patient Instructions (Addendum)
Please stop your symbicort Continue your antibiotics as directed by Dr Ninetta Lights Follow with Dr Delton Coombes in 3 months or sooner if you have any problems.

## 2012-03-23 NOTE — Assessment & Plan Note (Addendum)
CT scan consistent with [facility], and he has been restarted on azithro. Hopefully he will tolerate, but he has not in the past. He may need an alternative regimen, maybe an IV regimen if this fails.  - abx as directed by Dr Ninetta Lights.

## 2012-03-24 ENCOUNTER — Ambulatory Visit (INDEPENDENT_AMBULATORY_CARE_PROVIDER_SITE_OTHER): Payer: 59 | Admitting: Physician Assistant

## 2012-03-24 ENCOUNTER — Telehealth: Payer: Self-pay | Admitting: *Deleted

## 2012-03-24 ENCOUNTER — Encounter: Payer: Self-pay | Admitting: Physician Assistant

## 2012-03-24 VITALS — BP 132/90 | HR 55 | Ht 66.0 in | Wt 148.2 lb

## 2012-03-24 DIAGNOSIS — I10 Essential (primary) hypertension: Secondary | ICD-10-CM | POA: Insufficient documentation

## 2012-03-24 DIAGNOSIS — I2510 Atherosclerotic heart disease of native coronary artery without angina pectoris: Secondary | ICD-10-CM

## 2012-03-24 DIAGNOSIS — R079 Chest pain, unspecified: Secondary | ICD-10-CM

## 2012-03-24 DIAGNOSIS — R0602 Shortness of breath: Secondary | ICD-10-CM

## 2012-03-24 DIAGNOSIS — E785 Hyperlipidemia, unspecified: Secondary | ICD-10-CM | POA: Insufficient documentation

## 2012-03-24 MED ORDER — NITROGLYCERIN 0.4 MG SL SUBL: 0.4000 mg | SUBLINGUAL_TABLET | SUBLINGUAL | Status: AC | PRN

## 2012-03-24 NOTE — Patient Instructions (Addendum)
Your physician has requested that you have an echocardiogram THIS IS TO BE DONE THIS WEEK OR EARLY NEXT WEEK PER SCOTT WEAVER, PAC  DX 414.01, 786.50. Echocardiography is a painless test that uses sound waves to create images of your heart. It provides your doctor with information about the size and shape of your heart and how well your heart's chambers and valves are working. This procedure takes approximately one hour. There are no restrictions for this procedure.  Your physician has requested that you have en exercise stress myoview THIS IS TO BE DONE 1 WEEK AFTER ECHO . For further information please visit [email or web address] Please follow instruction sheet, as given.   A PRESCRIPTION FRO NITROGLYCERIN HAS BEEN SENT IN TO YOUR PHARAMACY AND YOU HAVE BEEN INSTRUCTED AS TO HOW AND WHEN TO USE NTG.  Your physician recommends that you return for lab work in: BNP WITH A STAT TROPONIN I TODAY  PLEASE FOLLOW UP WITH DR. Excell Seltzer OR SCOTT WEAVER, PAC SAME DAY DR. Excell Seltzer IS IN THE OFFICE

## 2012-03-24 NOTE — Telephone Encounter (Signed)
Message copied by Tarri Fuller on Tue Mar 24, 2012 11:49 AM ------      Message from: [place], [place] T      Created: Tue Mar 24, 2012 11:11 AM       Normal      Continue with current treatment plan.      Tereso Newcomer, PA-C  11:11 AM 03/24/2012

## 2012-03-24 NOTE — Telephone Encounter (Signed)
pt notified about CK normal, pt verbalized understanding

## 2012-03-24 NOTE — Progress Notes (Signed)
[address], [address] [place], [place]  [address] Phone: [phone], Fax:  [phone]  Date:  03/24/2012   Name:  Caleb [name]   DOB:  [birth year]   MRN:  [id number]  PCP:  Marylen Ponto, MD  Primary Cardiologist:  Dr. Tonny Bollman  Primary Electrophysiologist:  None    History of Present Illness: Caleb [name] is a 61 y.o. male who returns for evaluation of chest pain.  He has a hx of HTN, HL, COPD. He was evaluated by Dr. Excell Seltzer in 1/13 for surgical clearance. He had a recent CT scan that demonstrated a RUL nodule. CT scan also demonstrated coronary calcification. Patient had a prior cath in [facility] that demonstrated non-obstructive CAD (by report).  Preoperative ETT-Myoview demonstrated no ischemia with an EF of 58%. Of note, the patient exercised for 11 minutes 30 seconds without significant ST-T wave changes. Patient underwent VATS 2/13. This was consistent with necrotizing granuloma. 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 PCR and quantiferon gold were both negative for TB. He was seen by ID and was treated with 4 drug therapy for TB. However, he could not tolerate this. Patient was seen in followup in the pulmonary clinic earlier this month for persistent cough.  He was placed on steroids, antibiotics and albuterol nebs. Culture grew out strep pneumoniae. Recent CT scan demonstrated peri-bronchovascular nodularity in the left lower lobe. Findings are questionable for infection such as atypical mycobacterial process. CT also demonstrated inhomogenous enhancement within the liver. He was recently seen by Dr. Ninetta Lights of ID. He was placed back on azithromycin 500 mg Monday, Wednesday, Friday. Patient is to be evaluated at [facility] for his liver lesion as well as a second opinion with pulmonary at [facility] Last seen by Dr. Delton Coombes 03/23/12.  Patient notes he had 2 episodes of chest pain recently that awoke him from sleep on 12/14 and 12/15.  The pain was sharp.  It radiated to  his jaw.  It lasted 20 mins.  He took ASA with relief.  He was slightly dyspneic.  No nausea.  He did have assoc diaphoresis.  No palpitations.  No syncope.  He did feel lightheaded.  He has not had a recurrence.  No exertional chest pain.  He notes worsening DOE with his current illness.  He is NYHA Class II-IIb.  He sleeps on 3 pillows.  Cough is worse with lying flat.  No PND.  No edema.    Labs (12/13):   K 3.9, creatinine 1.1, ALT 26, Hgb 14  Wt Readings from Last 3 Encounters:  03/23/12 148 lb 12.8 oz (67.495 kg)  03/17/12 153 lb (69.4 kg)  03/06/12 142 lb 12.8 oz (64.774 kg)     Past Medical History  Diagnosis Date  . Hypertension   . Emphysema   . Hyperlipidemia   . Allergic rhinitis   . Coronary artery disease     sm blockages on 2007 cath  . COPD (chronic obstructive pulmonary disease)     emyphesema  also  . Shortness of breath     DOE  . Recurrent upper respiratory infection (URI)     NOV  & DECEMBER 2012  . GERD (gastroesophageal reflux disease)   . Headache   . HOH (hard of hearing)     BOTH.....Marland KitchenLEFT IS BETTER THEN RIGHT    Current Outpatient Prescriptions  Medication Sig Dispense Refill  . albuterol (PROVENTIL) (2.5 MG/3ML) 0.083% nebulizer solution Take 3 mLs (2.5 mg total) by nebulization 4 (  four) times daily.  360 mL  6  . antipyrine-benzocaine (AURALGAN) otic solution Place 3 drops into the left ear every 2 (two) hours as needed.      [name] aspirin 81 MG tablet Take 81 mg by mouth 2 (two) times daily.       [name] azithromycin (ZITHROMAX) 500 MG tablet Take 1 tablet (500 mg total) by mouth every Monday, Wednesday, and Friday.  20 tablet  5  . budesonide-formoterol (SYMBICORT) 160-4.5 MCG/ACT inhaler Inhale 2 puffs into the lungs 2 (two) times daily.  1 Inhaler  0  . ipratropium (ATROVENT) 0.02 % nebulizer solution Take 2.5 mLs (500 mcg total) by nebulization 4 (four) times daily.  300 mL  6  . loratadine (CLARITIN) 10 MG tablet Take 10 mg by mouth daily.      [name]  losartan-hydrochlorothiazide (HYZAAR) 100-12.5 MG per tablet Take 1 tablet by mouth daily.      [name] lovastatin (MEVACOR) 20 MG tablet Take 20 mg by mouth at bedtime.      . Multiple Vitamin (MULITIVITAMIN WITH MINERALS) TABS Take 1 tablet by mouth daily.      [name] omeprazole (PRILOSEC) 20 MG capsule Take 1 capsule by mouth daily.        Allergies: Allergies  Allergen Reactions  . Omnipaque (Iohexol)     Hives,itching, 13 hr prep  . Avelox (Moxifloxacin Hcl In Nacl) Rash  . Codeine Rash    Social History:  The patient  reports that he quit smoking about 11 months ago. His smoking use included Cigarettes. He has a 40 pack-year smoking history. His smokeless tobacco use includes Snuff. He reports that he does not drink alcohol or use illicit drugs.   ROS:  Please see the history of present illness.   He had hemoptysis on one occasion that has cleared since starting the antibiotics.  No fever.  He has had post cough emesis.  He notes night sweats.  No weight loss.   All other systems reviewed and negative.   PHYSICAL EXAM: VS:  BP 132/90  Pulse 55  Ht 5\' 6"  (1.676 m)  Wt 148 lb 3.2 oz (67.223 kg)  BMI 23.92 kg/m2 Well nourished, well developed, in no acute distress HEENT: normal Neck: no JVD Vascular:  No carotid bruits bilat Cardiac:  normal S1, S2; RRR; no murmur; no rub Lungs:  Decreased breath sounds bilaterally, no wheezing, rhonchi or rales Abd: soft, nontender, no hepatomegaly Ext: no edema Skin: warm and dry Neuro:  CNs 2-12 intact, no focal abnormalities noted  EKG:  NSR, HR 67, normal axis, septal Q waves, poor R wave progression, nonspecific ST-T wave changes, no significant change when compared to prior tracings      ASSESSMENT AND PLAN:  1. Chest Pain:   Patient had a negative Myoview almost one year ago. He exercised for almost 12 minutes without EKG changes or chest pain. He did have 2 episodes of chest pain that awoke him from sleep. These were somewhat concerning for  angina. However, he's had a significant cough over the last 1+ month. It appears that he may have an atypical mycobacterial infection. He is being treated by pulmonary and infectious disease. He has noted increased dyspnea with exertion as well. He did have coronary calcification on CT scan in the past. It has been about 9-10 days since the 2 episodes of chest pain. I will obtain a troponin today. If this is abnormal, we will need to arrange cardiac catheterization for further evaluation.  I will also set him up for an echocardiogram. If he has no evidence of wall motion abnormality or reduced ejection fraction on echo, I will then have him undergo a followup ETT-Myoview. I will also check a BNP today.  2. Coronary Artery Disease:  Continue ASA , statin.  Proceed with workup as noted above.  I will provide him with a Rx for PRN NTG.  3. Hypertension:  Controlled.  Continue current therapy.   4. Hyperlipidemia:  Continue statin.  5. Mycobacterial Disease:  He continues follow up with ID and pulmonology.    6. Disposition:  Follow up with me or Dr. Tonny Bollman in 2 weeks.   Signed, Tereso Newcomer, PA-C  8:28 AM 03/24/2012

## 2012-03-27 ENCOUNTER — Telehealth: Payer: Self-pay | Admitting: *Deleted

## 2012-03-27 NOTE — Telephone Encounter (Signed)
pt notified about lab results today. has echo on 12/30

## 2012-03-27 NOTE — Telephone Encounter (Signed)
Message copied by Tarri Fuller on Fri Mar 27, 2012  9:21 AM ------      Message from: [place], [place] T      Created: Tue Mar 24, 2012  1:24 PM       Normal       Tereso Newcomer, [place]  1:24 PM 03/24/2012

## 2012-03-30 ENCOUNTER — Ambulatory Visit: Payer: 59 | Admitting: Pulmonary Disease

## 2012-03-30 ENCOUNTER — Telehealth: Payer: Self-pay | Admitting: *Deleted

## 2012-03-30 ENCOUNTER — Ambulatory Visit (HOSPITAL_COMMUNITY): Payer: 59 | Attending: Cardiology | Admitting: Radiology

## 2012-03-30 ENCOUNTER — Encounter: Payer: Self-pay | Admitting: Physician Assistant

## 2012-03-30 DIAGNOSIS — R072 Precordial pain: Secondary | ICD-10-CM

## 2012-03-30 DIAGNOSIS — I2510 Atherosclerotic heart disease of native coronary artery without angina pectoris: Secondary | ICD-10-CM

## 2012-03-30 DIAGNOSIS — J438 Other emphysema: Secondary | ICD-10-CM | POA: Insufficient documentation

## 2012-03-30 DIAGNOSIS — I10 Essential (primary) hypertension: Secondary | ICD-10-CM | POA: Insufficient documentation

## 2012-03-30 DIAGNOSIS — I359 Nonrheumatic aortic valve disorder, unspecified: Secondary | ICD-10-CM | POA: Insufficient documentation

## 2012-03-30 DIAGNOSIS — I369 Nonrheumatic tricuspid valve disorder, unspecified: Secondary | ICD-10-CM | POA: Insufficient documentation

## 2012-03-30 DIAGNOSIS — R079 Chest pain, unspecified: Secondary | ICD-10-CM

## 2012-03-30 NOTE — Progress Notes (Signed)
Echocardiogram performed.  

## 2012-03-30 NOTE — Telephone Encounter (Signed)
Message copied by Tarri Fuller on Mon Mar 30, 2012  5:50 PM ------      Message from: [place], [place] T      Created: Mon Mar 30, 2012  5:00 PM       Echo ok with      Normal LV function.      Proceed with stress testing as planned.      Tereso Newcomer, PA-C 03/30/2012 5:00 PM

## 2012-03-30 NOTE — Telephone Encounter (Signed)
pt notified about echo results and verified date and time of myoview 04/06/12, pt verbalized understanding

## 2012-04-06 ENCOUNTER — Ambulatory Visit (HOSPITAL_COMMUNITY): Payer: 59 | Attending: Cardiology | Admitting: Radiology

## 2012-04-06 VITALS — BP 130/83 | HR 60 | Ht 66.0 in | Wt 142.0 lb

## 2012-04-06 DIAGNOSIS — R002 Palpitations: Secondary | ICD-10-CM | POA: Insufficient documentation

## 2012-04-06 DIAGNOSIS — I959 Hypotension, unspecified: Secondary | ICD-10-CM

## 2012-04-06 DIAGNOSIS — R110 Nausea: Secondary | ICD-10-CM

## 2012-04-06 DIAGNOSIS — R42 Dizziness and giddiness: Secondary | ICD-10-CM | POA: Insufficient documentation

## 2012-04-06 DIAGNOSIS — R0609 Other forms of dyspnea: Secondary | ICD-10-CM | POA: Insufficient documentation

## 2012-04-06 DIAGNOSIS — I10 Essential (primary) hypertension: Secondary | ICD-10-CM | POA: Insufficient documentation

## 2012-04-06 DIAGNOSIS — R079 Chest pain, unspecified: Secondary | ICD-10-CM

## 2012-04-06 DIAGNOSIS — I2510 Atherosclerotic heart disease of native coronary artery without angina pectoris: Secondary | ICD-10-CM

## 2012-04-06 DIAGNOSIS — R0602 Shortness of breath: Secondary | ICD-10-CM

## 2012-04-06 DIAGNOSIS — R0789 Other chest pain: Secondary | ICD-10-CM | POA: Insufficient documentation

## 2012-04-06 DIAGNOSIS — R0989 Other specified symptoms and signs involving the circulatory and respiratory systems: Secondary | ICD-10-CM | POA: Insufficient documentation

## 2012-04-06 MED ORDER — TECHNETIUM TC 99M SESTAMIBI GENERIC - CARDIOLITE
33.0000 | Freq: Once | INTRAVENOUS | Status: AC | PRN
  Administered 2012-04-06: 33 via INTRAVENOUS

## 2012-04-06 MED ORDER — AMINOPHYLLINE 25 MG/ML IV SOLN
75.0000 mg | Freq: Once | INTRAVENOUS | Status: AC
  Administered 2012-04-06: 75 mg via INTRAVENOUS

## 2012-04-06 MED ORDER — REGADENOSON 0.4 MG/5ML IV SOLN
0.4000 mg | Freq: Once | INTRAVENOUS | Status: AC
  Administered 2012-04-06: 0.4 mg via INTRAVENOUS

## 2012-04-06 MED ORDER — TECHNETIUM TC 99M SESTAMIBI GENERIC - CARDIOLITE
11.0000 | Freq: Once | INTRAVENOUS | Status: AC | PRN
  Administered 2012-04-06: 11 via INTRAVENOUS

## 2012-04-06 NOTE — Progress Notes (Signed)
[facility] SITE 3 NUCLEAR MED [address] [place], [place] [address] [phone]    Cardiology Nuclear Med Study  Caleb [name] is a 62 y.o. male     MRN : [id number]     DOB: 02/26/1951  Procedure Date: 04/06/2012  Nuclear Med Background Indication for Stress Test:  Evaluation for Ischemia History:  '07 Cath:n/o CAD; 1/13 MPS:No ischemia, EF=58%; 03/10/12 IO:[id number] Calcifications; 03/30/12 Echo:EF=55% Cardiac Risk Factors: History of Smoking, Hypertension and Lipids  Symptoms:  Chest Pain/Pressure.  (last episode of chest discomfort was on 03/15/12), Diaphoresis, Dizziness, DOE/SOB, Fatigue, Palpitations and SOB   Nuclear Pre-Procedure Caffeine/Decaff Intake:  None NPO After: 7:00pm   Lungs:  Clear. O2 Sat: 98% on room air. IV 0.9% NS with Angio Cath:  20g  IV Site: R Hand  IV Started by:  Cathlyn Parsons, RN  Chest Size (in):  38 Cup Size: n/a  Height: 5\' 6"  (1.676 m)  Weight:  142 lb (64.411 kg)  BMI:  Body mass index is 22.92 kg/(m^2). Tech Comments:  n/a    Nuclear Med Study 1 or 2 day study: 1 day  Stress Test Type:  Lexiscan  Reading MD: Cassell Clement, MD  Order Authorizing Provider:  Tonny Bollman, MD and Tereso Newcomer, PA-C  Resting Radionuclide: Technetium 32m Sestamibi  Resting Radionuclide Dose: 11.0 mCi   Stress Radionuclide:  Technetium 70m Sestamibi  Stress Radionuclide Dose: 33.0 mCi           Stress Protocol Rest HR: 60 Stress HR: 110  Rest BP: 130/83 Stress BP: 152/80  Exercise Time (min): 6:15 METS: 7.0   Predicted Max HR: 161 bpm % Max HR: 68.32 bpm Rate Pressure Product: 13244    Dose of Adenosine (mg):  n/a Dose of Lexiscan: 0.4 mg Dose of Aminophylline:75 mg  Dose of Atropine (mg): n/a Dose of Dobutamine: n/a mcg/kg/min (at max HR)  Stress Test Technologist: Smiley Houseman, CMA-N  Nuclear Technologist:  Domenic Polite, CNMT     Rest Procedure:  Myocardial perfusion imaging was performed at rest 45 minutes following  the intravenous administration of Technetium 1m Sestamibi.  Rest ECG: NSR - Normal EKG  Stress Procedure:  The patient received IV Lexiscan 0.4 mg over 15-seconds.  Technetium 54m Sestamibi injected at 30-seconds.  The patient was hypotensive with Lexiscan and felt weak and nauseous.  He was given Aminophylline 75 mg IV and 250 cc normal saline with relief.  He was Quantitative spect images were obtained after a 45 minute delay.  Stress ECG: No significant change from baseline ECG  QPS Raw Data Images:  Normal; no motion artifact; normal heart/lung ratio. Stress Images:  Normal homogeneous uptake in all areas of the myocardium. Rest Images:  Normal homogeneous uptake in all areas of the myocardium. Subtraction (SDS):  No evidence of ischemia. Transient Ischemic Dilatation (Normal <1.22):  1.15 Lung/Heart Ratio (Normal <0.45):  0.33  Quantitative Gated Spect Images QGS EDV:  97 ml QGS ESV:  39 ml  Impression Exercise Capacity:  Lexiscan with no exercise. BP Response:  Hypotensive blood pressure response. Clinical Symptoms:  Nausea and hypotension from lexiscan ECG Impression:  No significant ST segment change suggestive of ischemia. Comparison with Prior Nuclear Study: No images to compare  Overall Impression:  Normal stress nuclear study.  LV Ejection Fraction: 59%.  LV Wall Motion:  NL LV Function; NL Wall Motion  [employer]

## 2012-04-07 ENCOUNTER — Encounter: Payer: Self-pay | Admitting: Physician Assistant

## 2012-04-07 ENCOUNTER — Telehealth: Payer: Self-pay | Admitting: *Deleted

## 2012-04-07 NOTE — Telephone Encounter (Signed)
lmom normal myoview 

## 2012-04-07 NOTE — Telephone Encounter (Signed)
Message copied by Tarri Fuller on Tue Apr 07, 2012 10:30 AM ------      Message from: [place], [place] T      Created: Tue Apr 07, 2012 10:11 AM       Please inform patient stress test normal.      Tereso Newcomer, PA-C  10:11 AM 04/07/2012

## 2012-04-08 ENCOUNTER — Encounter: Payer: Self-pay | Admitting: Infectious Diseases

## 2012-04-08 ENCOUNTER — Ambulatory Visit (INDEPENDENT_AMBULATORY_CARE_PROVIDER_SITE_OTHER): Payer: 59 | Admitting: Infectious Diseases

## 2012-04-08 VITALS — BP 133/86 | HR 65 | Temp 98.1°F | Ht 66.0 in | Wt 144.0 lb

## 2012-04-08 DIAGNOSIS — A319 Mycobacterial infection, unspecified: Secondary | ICD-10-CM

## 2012-04-08 NOTE — Progress Notes (Signed)
Subjective:    Patient ID: Caleb [name], male    DOB: 09-03-1950, 62 y.o.   MRN: [id number]  HPI 62 yo M with hx of HTN, COPD, hyperlipidemia, prev cardiac cath/angina. States he had CXR (-) July 2011. In November 2012 had URI. Took course of anbx and this improved but then recurred in December. He was seen and asked for CXR as he was having chest pain in his R upper chest. This was found to show a mass. He underwent a CT scan And then a PET scan (04-10-11):The right apical lung nodule demonstrates primarily low level nonmalignant range hypermetabolism. A portion of its peripheral/pleural-based component demonstrates borderline malignant range hypermetabolism. 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 His serum quantiferon gold was (-). A PCR done on his VATS specimen was (-) for TB as well. He was started on 4 drug TB rx and sent home 05-14-11. He followed up with his health dept and continued on 4 drug rx until 05-23-11. He developed a rash at that time. His rash improved with benadryl initially. He then went to ED on 2-22 and was given steroid injection and rash resolved.  His AFB Cx has had minimal growth. Has had this probed and was TB (-).  His only foreign travel was [place] 10 years ago.  I spoke with Health Dept and [facility] with advice to start him on rifampin 600mg  daily, ETH 20/kg/daily, azithro 500mg  MWF and 250mg  T/TH. Treatment to last 1 year.  He took Rif/Azithro (did not get [facility]) and quickly developed a rash. He stopped his meds on June 10 (had been on less than 1 week).  He underwent repeat CT scan (11-07-11)- 1. Stable CT of the chest. Stable postop change within the right upper lobe without evidence for recurrence of nodules/mass. 2. Mild emphysema.  Had sputum sample  sent 03-04-12 which grew S pnumoniae (I- PEN, Ceftriaxone, S-LVQ).  Was seen in Pulm clinic for f/u on 03-06-12 after 2 episodes of worsening cough, SOB, hypoxia, fever. Was treated with steroids, anbx and albuterol nebs.  He underwent repeat CT scan (03-11-12) showing: "No evidence of pulmonary embolism. Peribronchovascular nodularity in the left lower lobe new since previous exam question infection such as atypical mycobacterial process. Larger nonspecific 7 mm and 12 x 15 mm nodular foci left lower lobe could represent more focal confluent or granulomatous process; however these require follow-up CT evaluation in 4-6 months to ensure resolution in order to exclude tumor. Area of inhomogeneous enhancement within liver 2.4 x 1.7 cm, new. This could represent an area of focal sparing in a background of fatty infiltration though a subtle hypervascular mass is not excluded. " On 03-17-12 he was restarted on azithro with plan to restart [facility] as well if tolerated.  He has had repeat CV eval due to recurrence of CP- planned for cath.  Feels like his breathing has been getting better. Still has DOE. No problems with azithro so far. No fever has had occas chills (most likely weather related?). Has had 2nd opinion at [facility] pulmonary.    Review of Systems  Constitutional: Negative for unexpected weight change.  Cardiovascular: Positive for chest pain.  Gastrointestinal: Negative for diarrhea and constipation.  Genitourinary: Negative for dysuria.       Objective:   Physical Exam  Constitutional: He appears well-developed and well-nourished.  Eyes: EOM are normal. Pupils are equal, round, and reactive to light.  Neck: Neck supple.  Cardiovascular: Normal rate, regular rhythm and normal heart sounds.   Pulmonary/Chest: Effort normal and breath sounds normal. No respiratory distress. He has no wheezes.  Abdominal: Soft. Bowel sounds are normal. There is no tenderness.  Lymphadenopathy:    He has no cervical  adenopathy.          Assessment & Plan:

## 2012-04-08 NOTE — Assessment & Plan Note (Signed)
He has had f/u at [facility] and would like to have a repeat BAL/bronch/bx. I have asked him to stop azithro for at leat 2 weeks prior to this test to try and ensure that he has an accurate test/Cx. Will see him back after this and when he gets tests results back.

## 2012-04-09 ENCOUNTER — Ambulatory Visit: Payer: 59 | Admitting: Physician Assistant

## 2012-04-16 LAB — AFB CULTURE WITH SMEAR (NOT AT ARMC): Acid Fast Smear: NONE SEEN

## 2012-04-23 ENCOUNTER — Ambulatory Visit: Payer: 59 | Admitting: Cardiothoracic Surgery

## 2012-05-27 ENCOUNTER — Other Ambulatory Visit: Payer: Self-pay | Admitting: *Deleted

## 2012-05-27 DIAGNOSIS — R222 Localized swelling, mass and lump, trunk: Secondary | ICD-10-CM

## 2012-05-28 ENCOUNTER — Ambulatory Visit
Admission: RE | Admit: 2012-05-28 | Discharge: 2012-05-28 | Disposition: A | Discharge status: Home / Self Care | Payer: 59 | Source: Ambulatory Visit | Attending: Cardiothoracic Surgery | Admitting: Cardiothoracic Surgery

## 2012-05-28 ENCOUNTER — Ambulatory Visit (INDEPENDENT_AMBULATORY_CARE_PROVIDER_SITE_OTHER): Payer: 59 | Admitting: Cardiothoracic Surgery

## 2012-05-28 ENCOUNTER — Encounter: Payer: Self-pay | Admitting: Cardiothoracic Surgery

## 2012-05-28 VITALS — BP 124/85 | HR 76 | Resp 20 | Ht 66.0 in | Wt 144.0 lb

## 2012-05-28 DIAGNOSIS — R222 Localized swelling, mass and lump, trunk: Secondary | ICD-10-CM

## 2012-05-28 DIAGNOSIS — Z09 Encounter for follow-up examination after completed treatment for conditions other than malignant neoplasm: Secondary | ICD-10-CM

## 2012-05-28 NOTE — Progress Notes (Signed)
[address]       [place] [address]             [phone]      Caleb [name] [facility] Medical Record #[id number] Date of Birth: 1950-08-15  Marylen Ponto, MD Dr Ninetta Lights, MD  Chief Complaint:   PostOp Follow Up Visit PREOPERATIVE DIAGNOSIS: New right upper lobe lung lesion, suspicious  for carcinoma of the lung.  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  PROCEDURE PERFORMED: Bronchoscopy, right video-assisted thoracoscopy,  mini thoracotomy, wedge resection of right upper lobe lung lesion,  placement of On-Q. 05/08/2011  History of Present Illness:     Patient is doing well following his resection of right upper lobe lung nodule which on final path was a granuloma, 1 AFB smear was positive and the patient was started on tuberculosis medications. In March he developed severe rash was seen in his primary care office and in the emergency room and according to the patient in consultation with the public health Department stopped the tuberculosis medications. He was restarted on antibiotic regimen a second time and again developed severe rash and is now off therapy. Overall he feels well denies any cough. He has been smoke-free since his right that's and lung biopsy in January 2013.  He continues to use chewing tobacco.  He notes in December of 2013 he developed severe respiratory infection requiring hospitalization. Several weeks ago he went to [facility] saw Dr. Arvilla Market for second opinion on his pulmonary status. 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    History  Smoking status  . Former Smoker -- 1.00 packs/day for 40 years  . Types: Cigarettes  . Quit date: 04/18/2011  Smokeless tobacco  . Current User  . Types: Snuff    Comment: uses pouches 3 times a  day       Allergies  Allergen Reactions  . Omnipaque (Iohexol)     Hives,itching, 13 hr prep  . Avelox (Moxifloxacin Hcl In Nacl) Rash  . Codeine Rash    Current Outpatient Prescriptions  Medication Sig Dispense Refill  . albuterol (PROVENTIL) (2.5 MG/3ML) 0.083% nebulizer solution Take 3 mLs (2.5 mg total) by nebulization 4 (four) times daily.  360 mL  6  . antipyrine-benzocaine (AURALGAN) otic solution Place 3 drops into the left ear every 2 (two) hours as needed.      [name] aspirin 81 MG tablet Take 81 mg by mouth 2 (two) times daily.       [name] ipratropium (ATROVENT) 0.02 % nebulizer solution Take 2.5 mLs (500 mcg total) by nebulization 4 (four) times daily.  300 mL  6  . loratadine (CLARITIN) 10 MG tablet Take 10 mg by mouth daily.      [name] losartan-hydrochlorothiazide (HYZAAR) 100-12.5 MG per tablet Take 1 tablet by mouth daily.      [name] lovastatin (MEVACOR) 20 MG tablet Take 20 mg by mouth at bedtime.      . Multiple Vitamin (MULITIVITAMIN WITH MINERALS) TABS Take 1 tablet by mouth daily.      . nitroGLYCERIN (NITROSTAT) 0.4 MG SL tablet Place 1 tablet (0.4 mg total) under the tongue every 5 (five) minutes as needed  for chest pain.  25 tablet  3  . omeprazole (PRILOSEC) 20 MG capsule Take 1 capsule by mouth daily.       No current facility-administered medications for this visit.       Physical Exam: BP 124/85  Pulse 76  Resp 20  Ht 5\' 6"  (1.676 m)  Wt 144 lb (65.318 kg)  BMI 23.25 kg/m2  SpO2 97%  General appearance: alert, cooperative and no distress Neurologic: intact Heart: regular rate and rhythm, S1, S2 normal, no murmur, click, rub or gallop Lungs: clear to auscultation bilaterally Abdomen: soft, non-tender; bowel sounds normal; no masses,  no organomegaly Extremities: extremities normal, atraumatic, no cyanosis or edema and Homans sign is negative, no sign of DVT Wounds:right chest incision sites well healed  Diagnostic Studies & Laboratory data:         Recent  Radiology Findings:  Dg Chest 2 View  05/28/2012  *RADIOLOGY REPORT*  Clinical Data: Follow-up evaluation of chest mass.  History of lung resection in February 2013.  Recent history of pneumonia. Shortness of breath.  CHEST - 2 VIEW  Comparison: Chest x-ray 03/04/2012.  Findings: Lung volumes are normal.  Postoperative changes of wedge resection are noted in the apex of the right upper lobe, with some mild chronic scarring in this region.  Lungs are otherwise clear. No pleural effusions.  Pulmonary vasculature and the cardiomediastinal silhouette are within normal limits. Atherosclerosis in the thoracic aorta.  IMPRESSION: 1.  No radiographic evidence of acute cardiopulmonary disease.  The appearance of the chest is essentially unchanged, as detailed above.   Original Report Authenticated By: Trudie Reed, M.D.       Recent Labs: Lab Results  Component Value Date   WBC 10.5 03/04/2012   HGB 14.0 03/04/2012   HCT 42.0 03/04/2012   PLT 341.0 03/04/2012   GLUCOSE 95 03/04/2012   ALT 26 03/04/2012   AST 27 03/04/2012   NA 134* 03/04/2012   K 3.9 03/04/2012   CL 96 03/04/2012   CREATININE 1.1 03/04/2012   BUN 15 03/04/2012   CO2 27 03/04/2012   INR 0.94 05/06/2011      Assessment / Plan:      Patient doing well following wedge resection of right upper lobe lesion, no malignancy was found. Smears did indicate presence of AFB subsequent tests have not confirmed this.  The patient continues to have problems with respiratory infections without any definitive cultures. Recent BAL at [facility] results are still pending per the patient. I've not made him a return appointment to see me as he's followed by 2 pulmonary departments and infectious disease. I would be glad to see him at any time should he require further surgical intervention. He was congratulated on remaining smoke free since his surgery.    Delight Ovens MD 05/28/2012 12:18 PM

## 2012-06-10 ENCOUNTER — Encounter: Payer: Self-pay | Admitting: Infectious Diseases

## 2012-07-29 ENCOUNTER — Ambulatory Visit: Payer: 59 | Admitting: Emergency Medicine

## 2012-09-21 IMAGING — CR DG CHEST 1V PORT
1 series · 1 of 1 positions shown · non-contrast
Comparison: Plain film chest 05/06/2011.

CLINICAL DATA: Status post VATS on the right.

PORTABLE CHEST - 1 VIEW

[view not recorded]
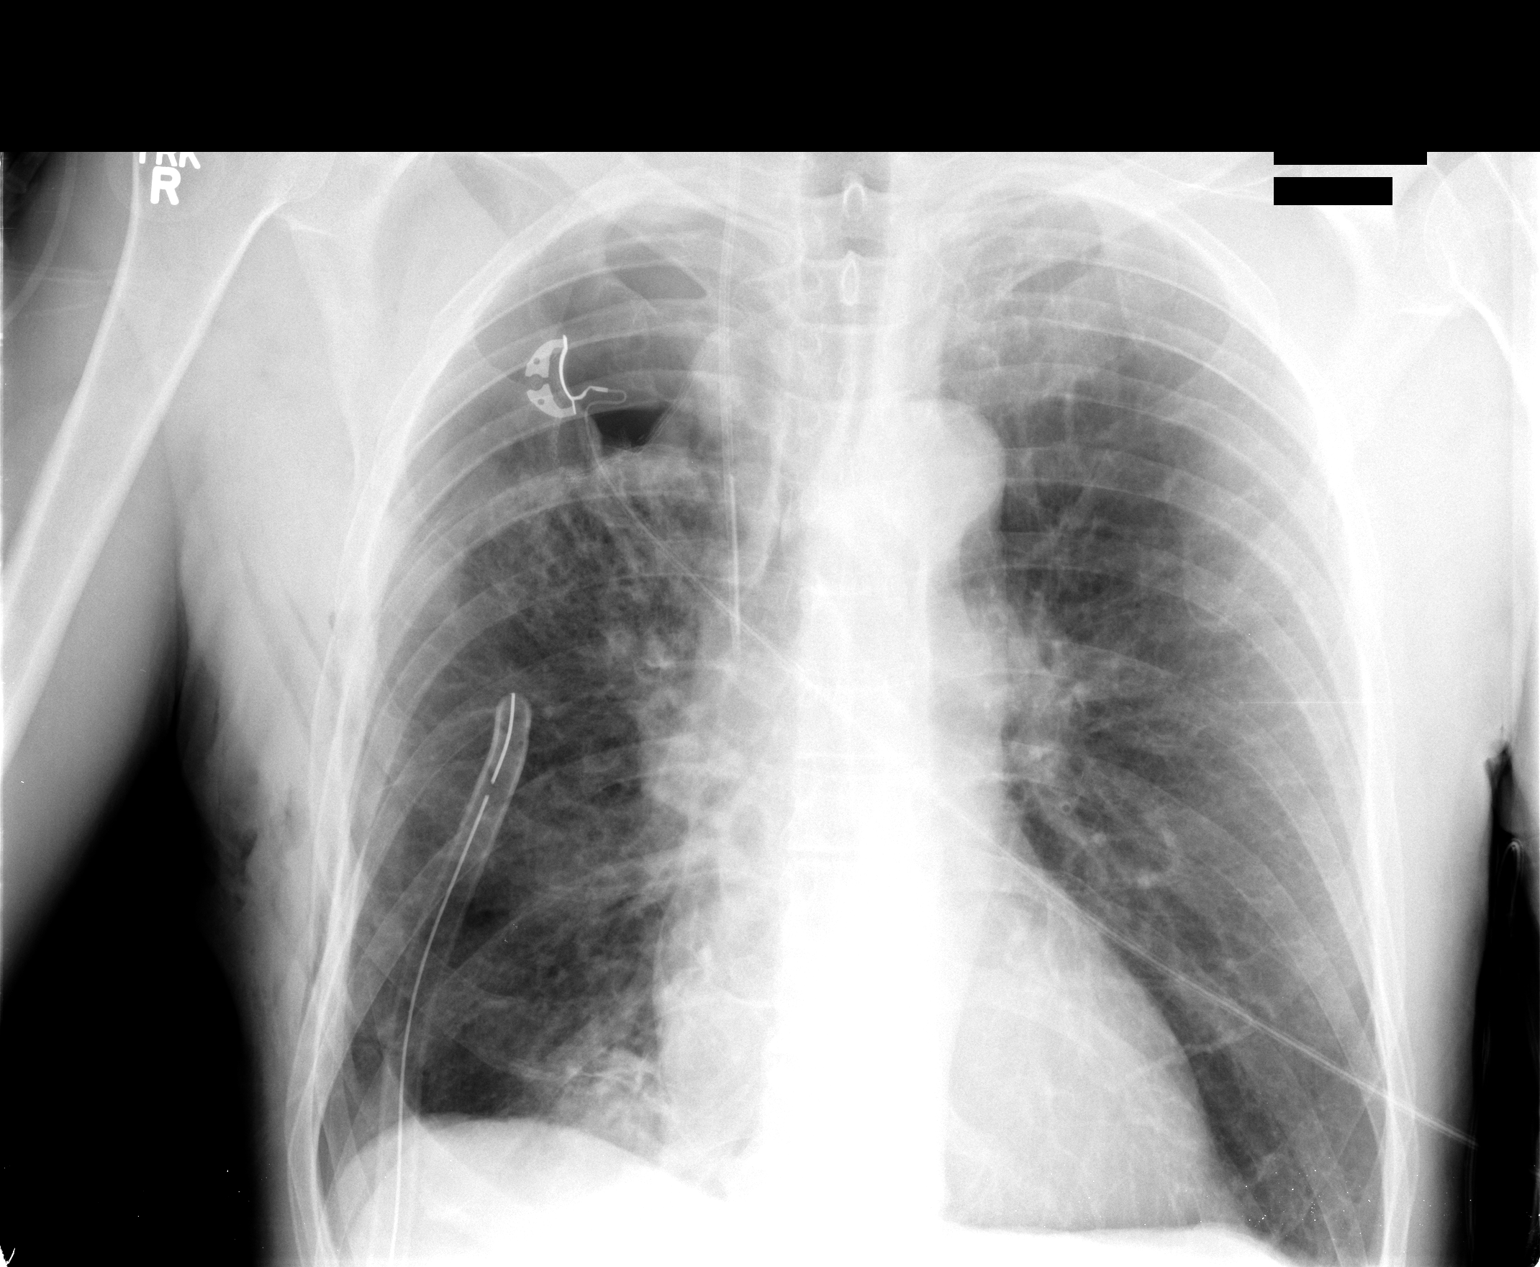

[1 of 1 positions shown; findings below may reference images not displayed]

FINDINGS: The patient is status post resection of a right upper
lobe lesion.  Right IJ catheter is in place with the tip in the
lower superior vena cava and a right chest tube identified.  The
patient has a right apical and likely right basilar pneumothorax
estimated at 20% or less overall.  Left lung is clear.  Heart size
is normal.
IMPRESSION: Postoperative change on the right as described with no pneumothorax
estimated at 20% or less.

## 2012-09-27 IMAGING — CR DG CHEST 2V
2 series · 2 of 2 positions shown · non-contrast
Comparison: 05/13/2011

CLINICAL DATA: Follow up right pneumothorax, history hypertension,
smoking, COPD/emphysema

CHEST - 2 VIEW

[w chest pa]
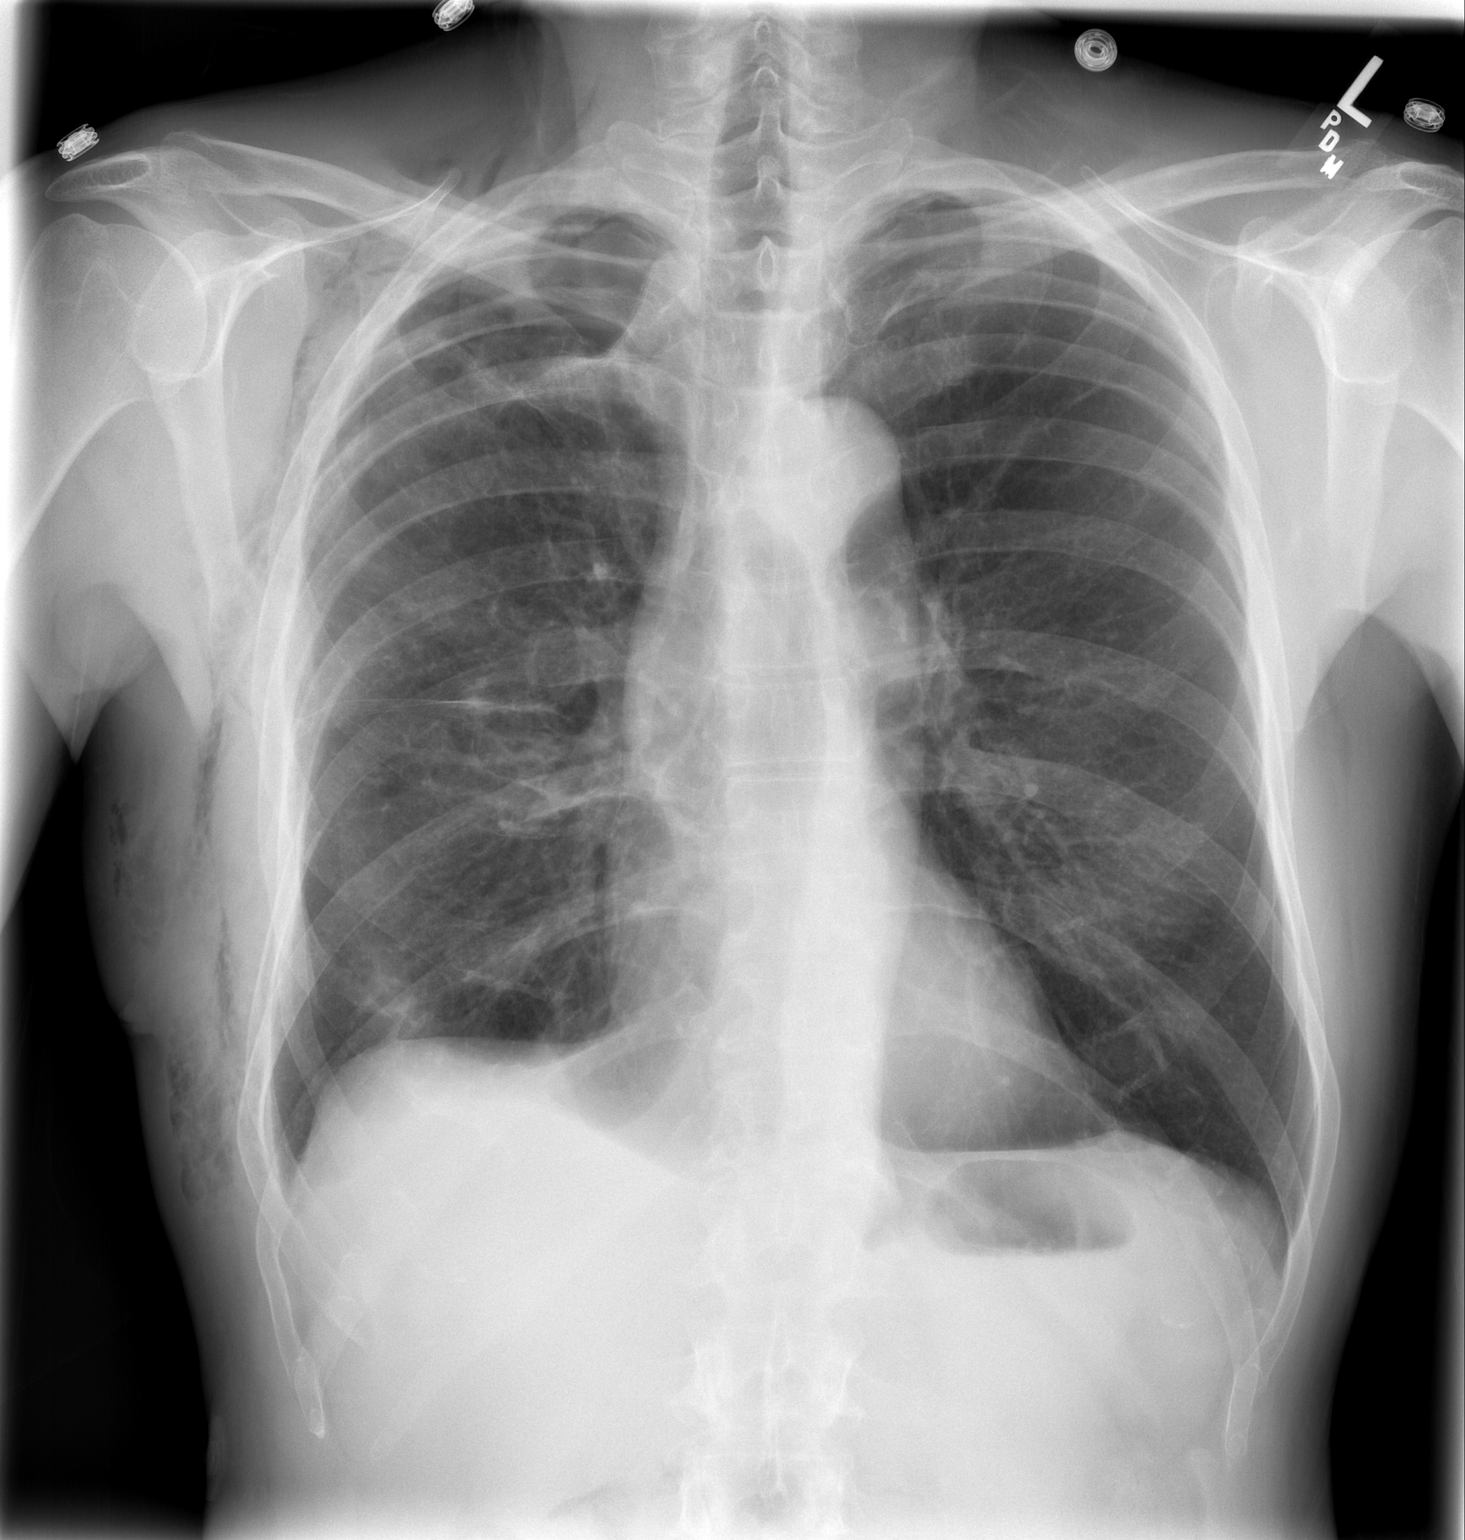

[w chest lat]
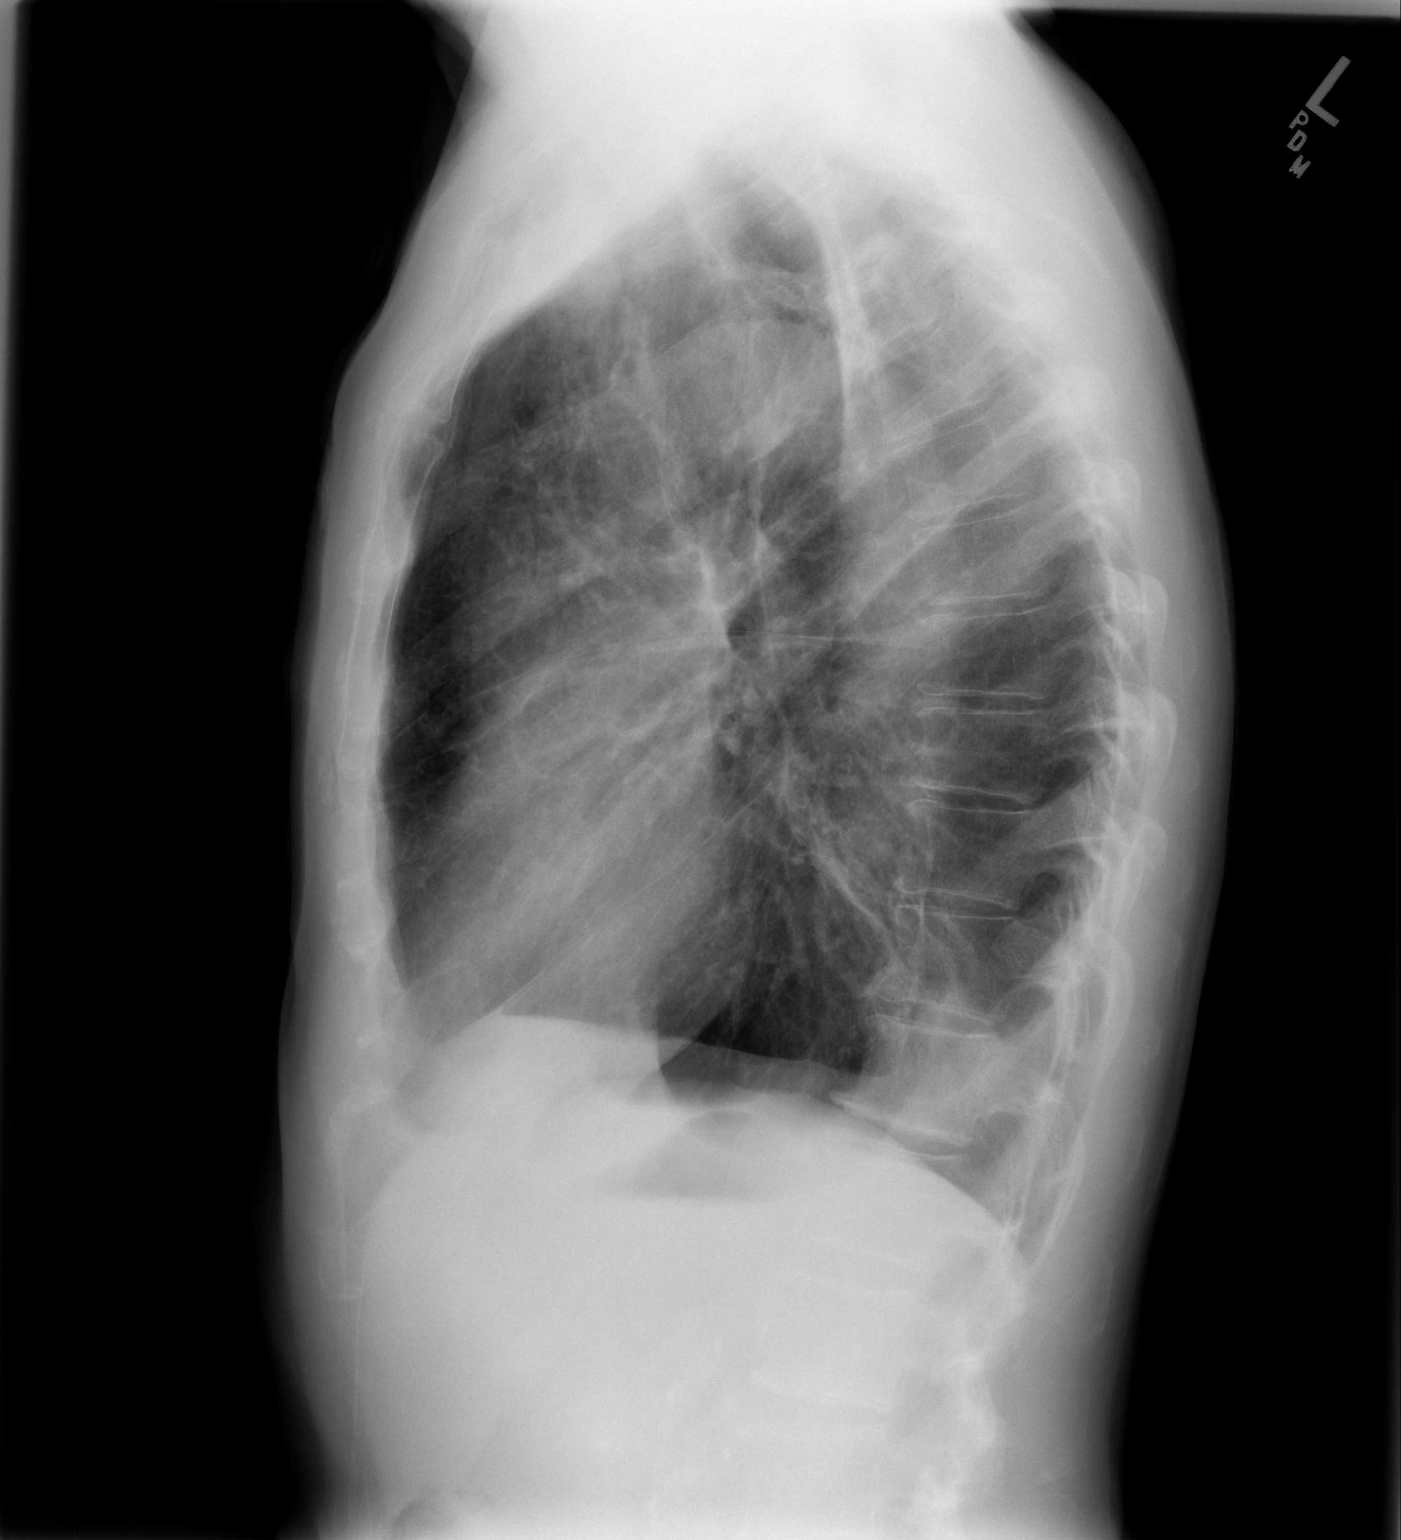

[2 of 2 positions shown; findings below may reference images not displayed]

FINDINGS: Prior surgical resection right apex.
Normal heart size, mediastinal contours, and pulmonary vascularity.
Persistent right apical pneumothorax little changed.
Right chest wall emphysema extending into right cervical region
again identified.
Lungs emphysematous with minimal atelectasis at right base.
No pulmonary infiltrate.
Small right pleural effusion.
Bones demineralized.
IMPRESSION: Postsurgical changes right upper lobe with persistent right apex
pneumothorax.
Underlying emphysematous changes with minimal atelectasis and small
effusion at right base.

## 2013-01-27 IMAGING — CR DG CHEST 2V
2 series · 2 of 2 positions shown · non-contrast
Comparison: 06/03/2011

CLINICAL DATA: Allergic reaction, shortness of breath.

CHEST - 2 VIEW

[w chest pa]
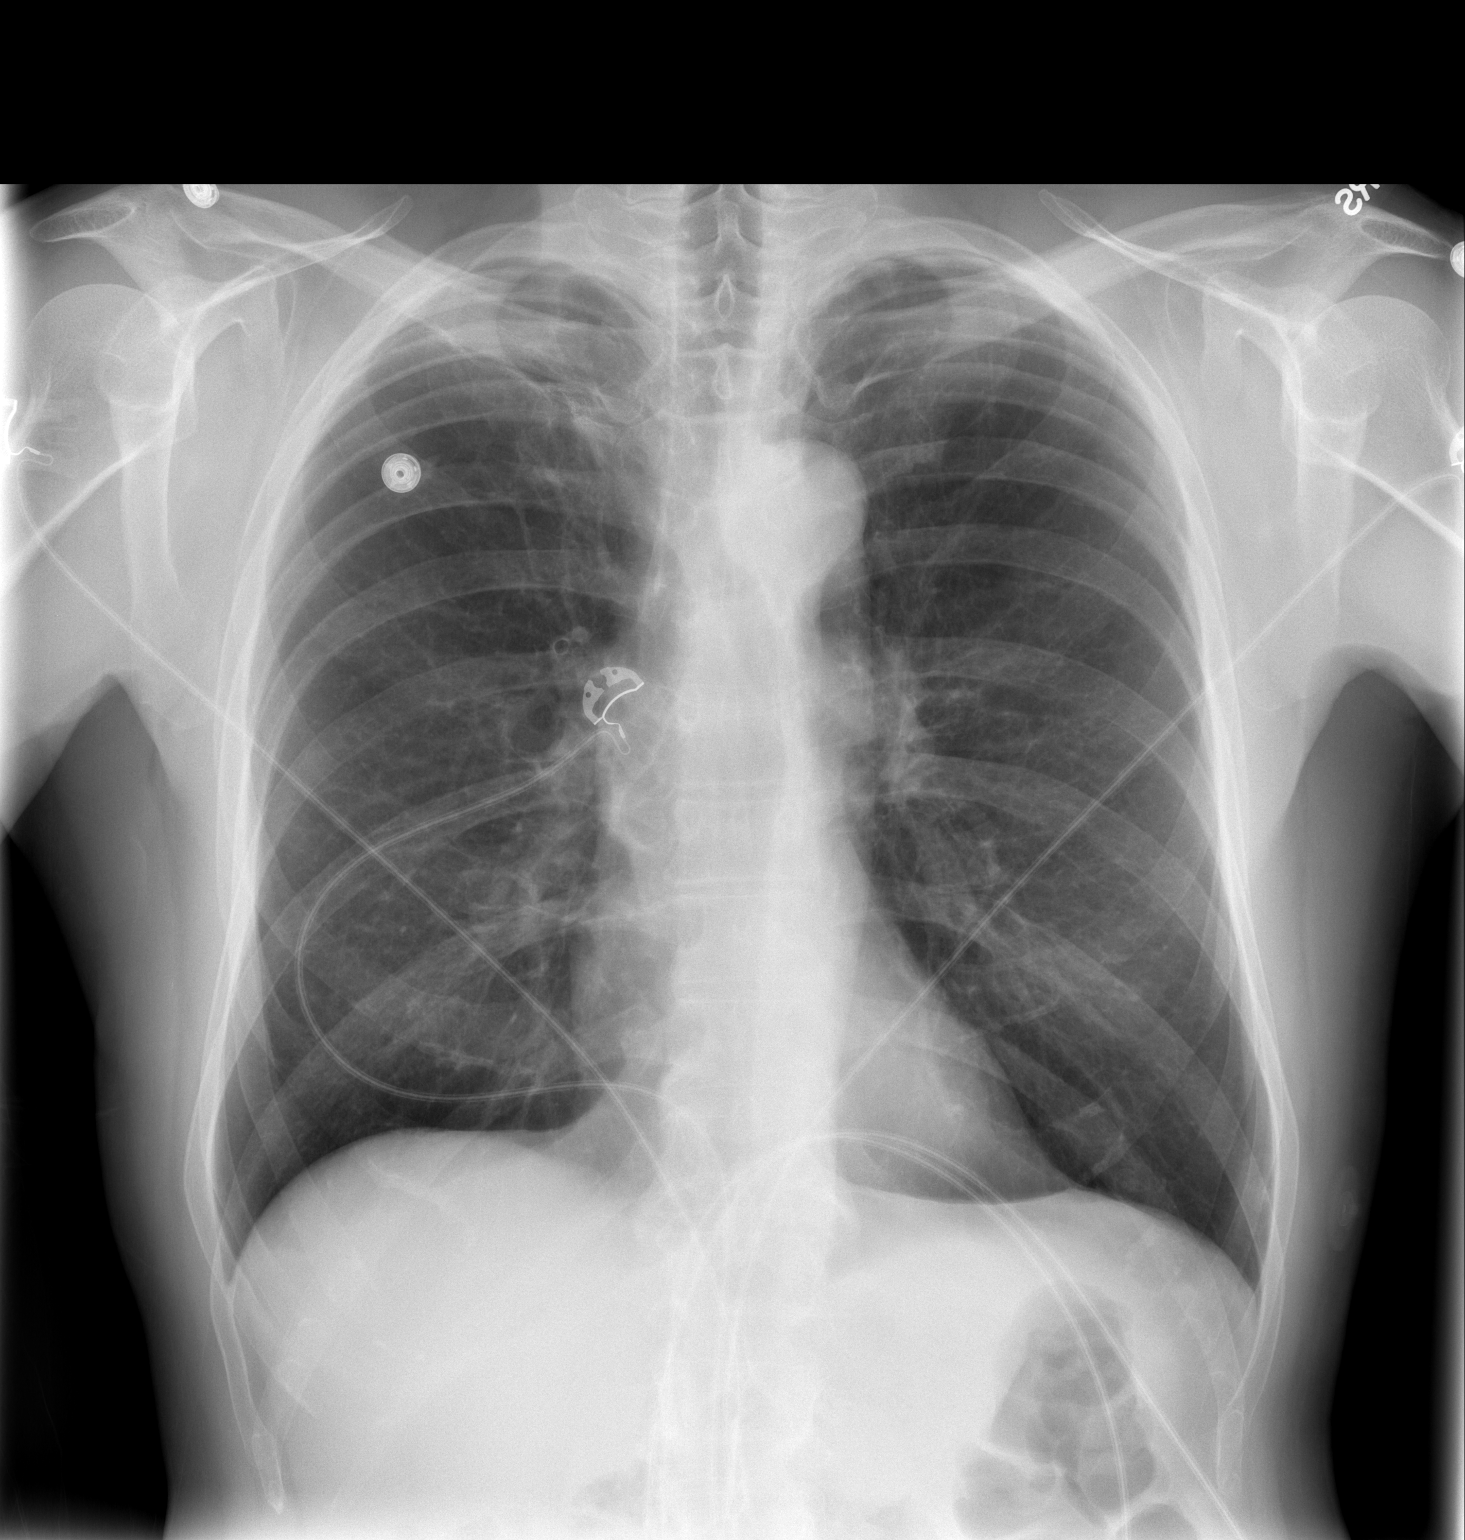

[w chest lat]
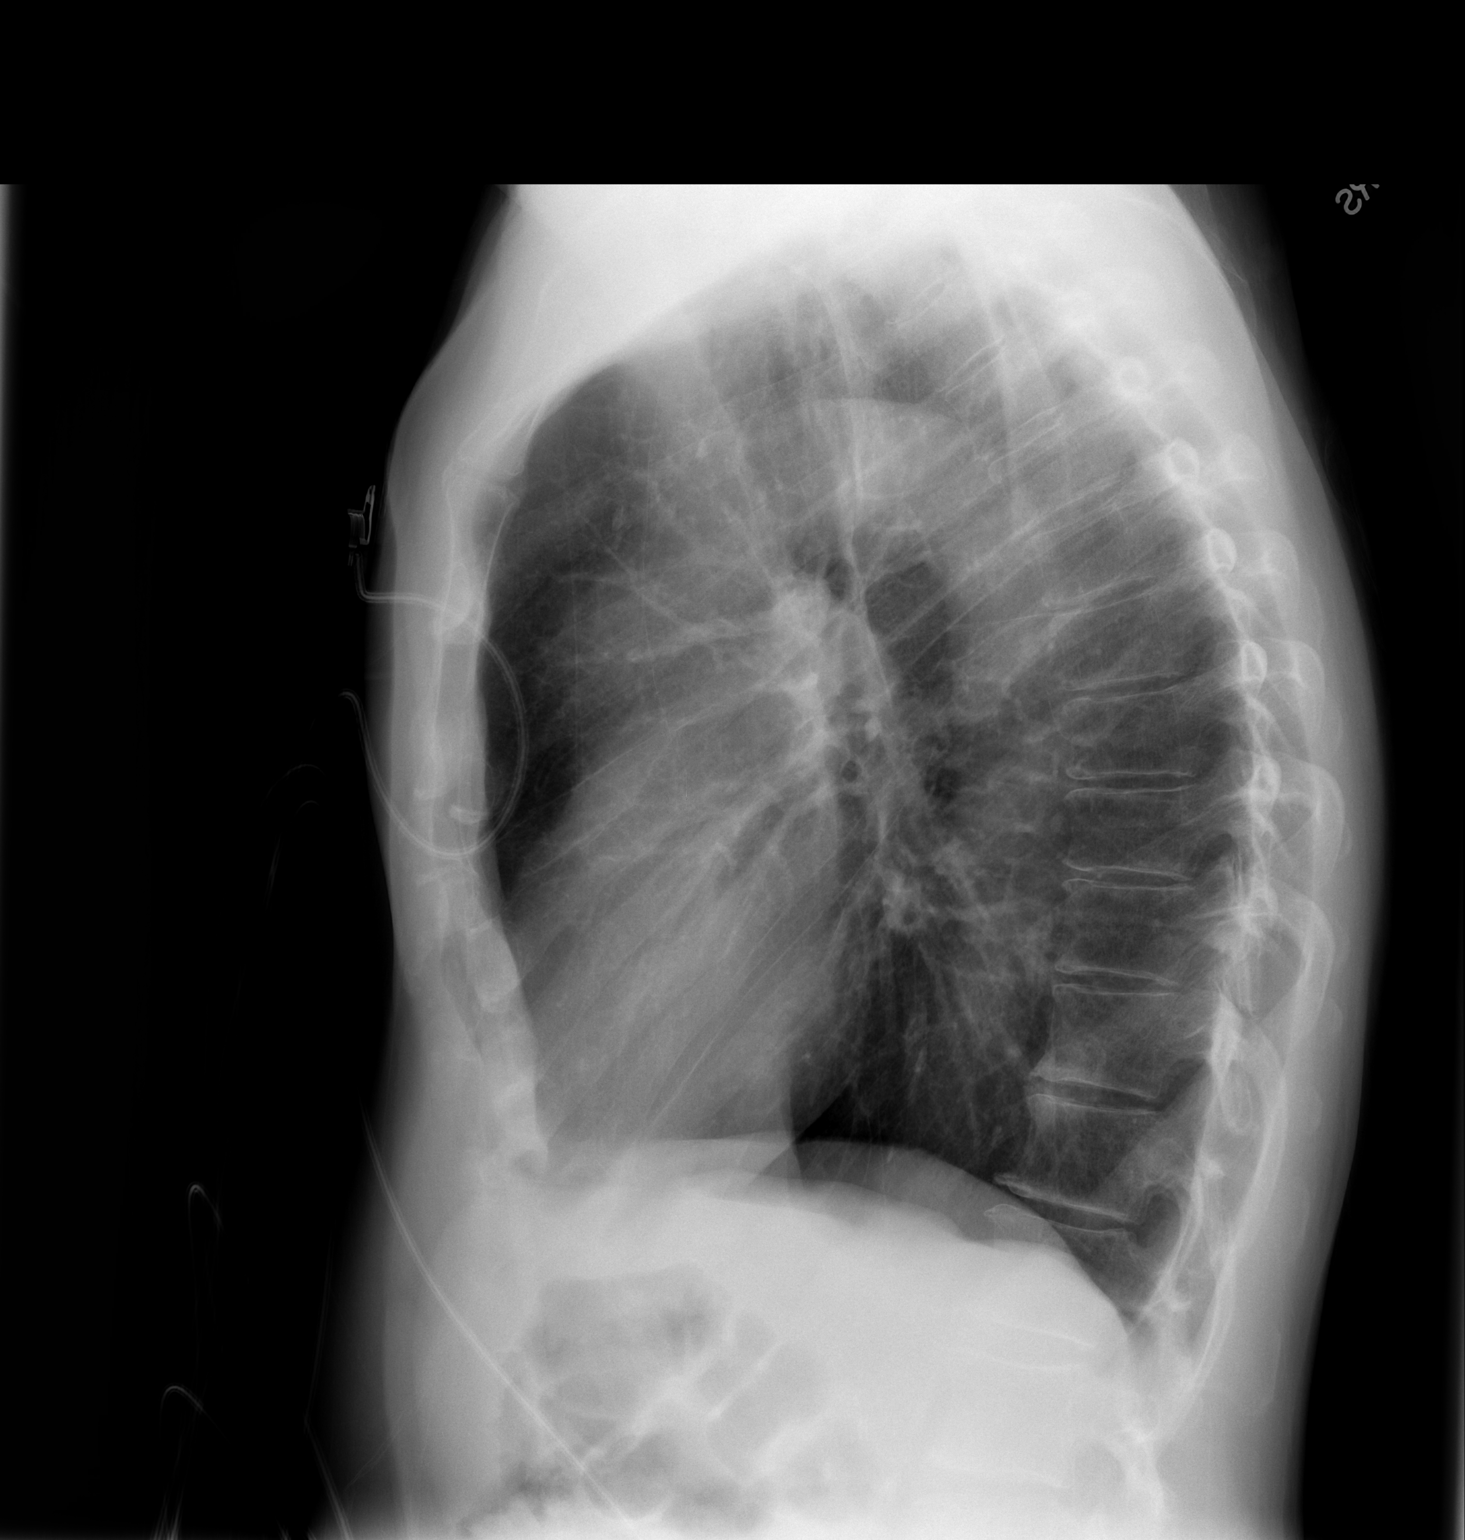

[2 of 2 positions shown; findings below may reference images not displayed]

FINDINGS: Right apical scarring/ surgical change.  Hyperinflation.}
no focal consolidation, pleural effusion, or pneumothorax.
Cardiomediastinal contours within normal limits.  Aortic
atherosclerosis.  No acute osseous finding.  Mild multilevel
degenerative change.
IMPRESSION: Postoperative changes of the right upper lobe.

Hyperinflation without focal consolidation.

## 2014-04-25 ENCOUNTER — Encounter: Payer: Self-pay | Admitting: *Deleted

## 2014-04-25 ENCOUNTER — Encounter: Payer: Self-pay | Admitting: Physician Assistant

## 2014-04-25 ENCOUNTER — Ambulatory Visit (INDEPENDENT_AMBULATORY_CARE_PROVIDER_SITE_OTHER): Payer: 59 | Admitting: Physician Assistant

## 2014-04-25 ENCOUNTER — Encounter (INDEPENDENT_AMBULATORY_CARE_PROVIDER_SITE_OTHER): Payer: 59

## 2014-04-25 VITALS — BP 122/74 | HR 62 | Ht 66.0 in | Wt 152.0 lb

## 2014-04-25 DIAGNOSIS — R079 Chest pain, unspecified: Secondary | ICD-10-CM

## 2014-04-25 DIAGNOSIS — R0789 Other chest pain: Secondary | ICD-10-CM | POA: Insufficient documentation

## 2014-04-25 DIAGNOSIS — I10 Essential (primary) hypertension: Secondary | ICD-10-CM

## 2014-04-25 DIAGNOSIS — E785 Hyperlipidemia, unspecified: Secondary | ICD-10-CM

## 2014-04-25 DIAGNOSIS — R002 Palpitations: Secondary | ICD-10-CM | POA: Insufficient documentation

## 2014-04-25 DIAGNOSIS — R0602 Shortness of breath: Secondary | ICD-10-CM

## 2014-04-25 DIAGNOSIS — R0609 Other forms of dyspnea: Secondary | ICD-10-CM | POA: Insufficient documentation

## 2014-04-25 NOTE — Progress Notes (Signed)
Cardiology Office Note   Date:  04/25/2014   ID:  Caleb, [name] 02-Feb-1951, MRN [id number]  PCP:  Ronita Hipps, MD  Cardiologist:  Dr. Burt Knack  Chief complaint: Dyspnea and fatigue    History of Present Illness: Caleb [name] is a 64 y.o. male patient of Dr. Burt Knack last seen in 2013, who presents for evaluation of dyspnea and fatigue. Patient has history of prior cath in [place] about 10 years ago and another one in [place] a few years ago demonstrating nonobstructive CAD according to patient. He had a preoperative exercise Myoview in 2013 demonstrating no ischemia EF 58%. The patient exercised 11 minutes 30 seconds without significant ST-T wave changes. 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 PCR and quantiferon gold were both negative for TB. He was seen by ID and was treated with 4 drug therapy for TB however he could not tolerate it.  Patient now comes in after being referred by Dr. Helene Kelp for 2-1/2-3 month history of extreme fatigue and dyspnea. The patient works as a Dealer and he says he gets very tired and out of breath with little activity. He says he can hardly change a tire or bend over to do his work without having to stop. He does know so little tightness in his chest and becomes clammy. This eases with rest. He has also noticed his heart beating irregular at times. It happens several times a day and is usually associated with activity. The patient quit smoking 3 years ago but does dip snuff. He has hypertension, hyperlipidemia, family history of CAD. Father died of an MI at 73, sister died of an MI at 54, another sister has no arrhythmia. A brother at age 31 had an abnormal stress test but had a normal cardiac cath and was diagnosed with heart failure. Patient is followed by Dr. Lennox Grumbles, pulmonologist at [facility]. He has not been seen in a while. They did have limited his CAT scans because he  developed thyroid problems. Patient is worried about walking on a treadmill because of extreme fatigue, arthritis, joint pain, prior back surgery.   Past Medical History  Diagnosis Date  . Hypertension   . Emphysema   . Hyperlipidemia   . Allergic rhinitis   . Coronary artery disease     a. sm blockages on 2007 cath;  b. ETT-MV 1/13:  no ischemia with an EF of 58%;  Lex MV 1/14:  EF 59%, no ischemia  . COPD (chronic obstructive pulmonary disease)     emyphesema  also  . Shortness of breath     DOE  . Recurrent upper respiratory infection (URI)     NOV  & DECEMBER 2012  . GERD (gastroesophageal reflux disease)   . Headache(784.0)   . HOH (hard of hearing)     BOTH.....Marland KitchenLEFT IS BETTER THEN RIGHT  . Hx of echocardiogram     a. echo 12/13:  EF 50-55%, Gr 1 diast dysfn, mild AI    Past Surgical History  Procedure Laterality Date  . Eye muscle surgery x3      2 left eye, 1 right eye  . Cardiac catheterization      2ND CATH IN 2007 @  [facility]  . Eye surgery    . Back surgery      LOWER WITH FUSION  . Knee surgery, left    . Video bronchoscopy  05/08/2011  Procedure: VIDEO BRONCHOSCOPY;  Surgeon: Grace Isaac, MD;  Location: [facility] OR;  Service: Thoracic;  Laterality: N/A;     Current Outpatient Prescriptions  Medication Sig Dispense Refill  . antipyrine-benzocaine (AURALGAN) otic solution Place 3 drops into the left ear every 2 (two) hours as needed.    [name] losartan-hydrochlorothiazide (HYZAAR) 100-12.5 MG per tablet Take 1 tablet by mouth daily.    [name] lovastatin (MEVACOR) 20 MG tablet Take 20 mg by mouth at bedtime.    . Multiple Vitamin (MULITIVITAMIN WITH MINERALS) TABS Take 1 tablet by mouth daily.    . nitroGLYCERIN (NITROSTAT) 0.4 MG SL tablet Place 1 tablet (0.4 mg total) under the tongue every 5 (five) minutes as needed for chest pain. 25 tablet 3  . omeprazole (PRILOSEC) 20 MG capsule Take 1 capsule by mouth daily.    [name] albuterol (PROVENTIL) (2.5 MG/3ML) 0.083%  nebulizer solution Take 3 mLs (2.5 mg total) by nebulization 4 (four) times daily. 360 mL 6   No current facility-administered medications for this visit.    Allergies:   Omnipaque; Avelox; and Codeine    Social History:  The patient  reports that he quit smoking about 3 years ago. His smoking use included Cigarettes. He has a 40 pack-year smoking history. His smokeless tobacco use includes Snuff. He reports that he does not drink alcohol or use illicit drugs.   Family History:  The patient's family history includes Allergies in his brother and sister; Emphysema in his mother; Esophageal cancer in his cousin; Heart attack (age of onset: 37) in his father; Heart disease in his mother; Heart disease (age of onset: 59) in his sister; Heart failure in his brother and father; Lymphoma in his cousin; Stomach cancer in his cousin.    ROS:  Please see the history of present illness.   Otherwise, review of systems are positive for hearing loss, chronic back pain, muscle pain, joint swelling, left hand numbness when talking on the phone, headaches and occasional dizziness if he gets up quickly, easy bruising.   All other systems are reviewed and negative.    PHYSICAL EXAM: VS:  BP 122/74 mmHg  Pulse 62  Ht 5\' 6"  (1.676 m)  Wt 152 lb (68.947 kg)  BMI 24.55 kg/m2 , BMI Body mass index is 24.55 kg/(m^2). GEN: Well nourished, well developed, in no acute distress HEENT: normal Neck: no JVD, carotid bruits, or masses Cardiac: RRR; distant heart sounds, no murmurs, rubs, or gallops,no edema  Respiratory:  Decreased breath sounds in the right lung otherwise clear to auscultation bilaterally, normal work of breathing GI: soft, nontender, nondistended, + BS MS: no deformity or atrophy Skin: warm and dry, no rash Neuro:  Strength and sensation are intact Psych: euthymic mood, full affect   EKG:  EKG is not ordered today. The ekg from Dr. Greggory Keen office on 04/15/14 shows normal sinus rhythm, no acute  change   Recent Labs: CBC reviewed from 04/15/14 was normal, chest x-ray COPD no active disease   Lipid Panel No results found for: CHOL, TRIG, HDL, CHOLHDL, VLDL, LDLCALC, LDLDIRECT    Wt Readings from Last 3 Encounters:  04/25/14 152 lb (68.947 kg)  05/28/12 144 lb (65.318 kg)  04/08/12 144 lb (65.318 kg)      Patient Active Problem List   Diagnosis Date Noted  . Dyspnea on exertion 04/25/2014  . Chest tightness 04/25/2014  . Palpitations 04/25/2014  . Essential hypertension 04/25/2014  . Essential hypertension, benign 03/24/2012  . HLD (hyperlipidemia) 03/24/2012  .  Abnormal CT scan 03/12/2012  . Tobacco abuse 10/17/2011  . Rash 09/16/2011  . Mycobacterial disease 08/22/2011  . Coronary atherosclerosis of native coronary artery 04/25/2011  . Chest mass 04/17/2011  . COPD (chronic obstructive pulmonary disease) 04/17/2011      Current medicines are reviewed at length with the patient today.  The patient does not have concerns regarding medicines.  The following changes have been made: No change   Labs/ tests ordered today include: Exercise Myoview with Lexi scan back up and 48 hour monitor   Orders Placed This Encounter  Procedures  . Myocardial Perfusion Imaging  . Holter monitor - 48 hour     Disposition:   FU with Dr. Burt Knack in one month   Signed, Ermalinda Barrios, PA-C  04/25/2014 10:29 AM    [address], [place], [place]  [address] Phone: [phone]; Fax: [phone]

## 2014-04-25 NOTE — Assessment & Plan Note (Signed)
Patient is on Mevacor and states his last lipid profile was normal.

## 2014-04-25 NOTE — Assessment & Plan Note (Signed)
Patient has turned half to 3 month history of worsening dyspnea on exertion associated with extreme fatigue and some chest tightness. He has history of 2 normal 1 in [place] 1 in [place]. He had a normal stress Myoview here in the office in 2013. He exercised 11-1/2 minutes. Recommend repeat stress Myoview. Patient is worried about being able to walk on the treadmill because of his extreme fatigue and arthritis. We will have a Lexi scan back up.

## 2014-04-25 NOTE — Assessment & Plan Note (Signed)
Patient complains of an irregular heartbeat that occurs several times every day. Will place 48-hour monitor to rule out arrhythmia.

## 2014-04-25 NOTE — Assessment & Plan Note (Signed)
Blood pressure controlled. 

## 2014-04-25 NOTE — Assessment & Plan Note (Signed)
Followed by Dr. Lennox Grumbles at [place] earlier follow-up than scheduled.

## 2014-04-25 NOTE — Progress Notes (Signed)
Patient ID: Caleb [name], male   DOB: 1951-03-30, 64 y.o.   MRN: [id number] [facility] 48 hour holter monitor applied to patient.

## 2014-04-25 NOTE — Patient Instructions (Addendum)
**Note De-Identified Caleb [name] Obfuscation** Your physician recommends that you continue on your current medications as directed. Please refer to the Current Medication list given to you today.  Your physician has requested that you have en exercise stress myoview. For further information please visit [email or web address]. Please follow instruction sheet, as given.  Your physician has recommended that you wear a holter monitor for 48 hours. Holter monitors are medical devices that record the heart's electrical activity. Doctors most often use these monitors to diagnose arrhythmias. Arrhythmias are problems with the speed or rhythm of the heartbeat. The monitor is a small, portable device. You can wear one while you do your normal daily activities. This is usually used to diagnose what is causing palpitations/syncope (passing out).  Your physician recommends that you schedule a follow-up appointment in: 1 month with Dr Burt Knack

## 2014-04-25 NOTE — Assessment & Plan Note (Signed)
Patient has mild chest tightness associated with dyspnea on exertion. Please see above dictation. Stress Myoview, follow-up with Dr. Burt Knack.

## 2014-05-03 ENCOUNTER — Ambulatory Visit (HOSPITAL_COMMUNITY): Payer: 59 | Attending: Cardiology | Admitting: Radiology

## 2014-05-03 VITALS — BP 112/72 | HR 62 | Ht 66.0 in | Wt 144.0 lb

## 2014-05-03 DIAGNOSIS — R0609 Other forms of dyspnea: Secondary | ICD-10-CM | POA: Insufficient documentation

## 2014-05-03 DIAGNOSIS — R079 Chest pain, unspecified: Secondary | ICD-10-CM | POA: Diagnosis not present

## 2014-05-03 DIAGNOSIS — R0602 Shortness of breath: Secondary | ICD-10-CM | POA: Insufficient documentation

## 2014-05-03 DIAGNOSIS — R002 Palpitations: Secondary | ICD-10-CM | POA: Insufficient documentation

## 2014-05-03 DIAGNOSIS — I2510 Atherosclerotic heart disease of native coronary artery without angina pectoris: Secondary | ICD-10-CM

## 2014-05-03 DIAGNOSIS — R5383 Other fatigue: Secondary | ICD-10-CM

## 2014-05-03 DIAGNOSIS — R51 Headache: Secondary | ICD-10-CM

## 2014-05-03 DIAGNOSIS — Z87891 Personal history of nicotine dependence: Secondary | ICD-10-CM | POA: Diagnosis not present

## 2014-05-03 DIAGNOSIS — I10 Essential (primary) hypertension: Secondary | ICD-10-CM | POA: Insufficient documentation

## 2014-05-03 DIAGNOSIS — R519 Headache, unspecified: Secondary | ICD-10-CM

## 2014-05-03 MED ORDER — REGADENOSON 0.4 MG/5ML IV SOLN
0.4000 mg | Freq: Once | INTRAVENOUS | Status: AC
  Administered 2014-05-03: 0.4 mg via INTRAVENOUS

## 2014-05-03 MED ORDER — TECHNETIUM TC 99M SESTAMIBI GENERIC - CARDIOLITE
33.0000 | Freq: Once | INTRAVENOUS | Status: AC | PRN
  Administered 2014-05-03: 33 via INTRAVENOUS

## 2014-05-03 MED ORDER — AMINOPHYLLINE 25 MG/ML IV SOLN
75.0000 mg | Freq: Once | INTRAVENOUS | Status: AC
  Administered 2014-05-03: 75 mg via INTRAVENOUS

## 2014-05-03 MED ORDER — TECHNETIUM TC 99M SESTAMIBI GENERIC - CARDIOLITE
11.0000 | Freq: Once | INTRAVENOUS | Status: AC | PRN
  Administered 2014-05-03: 11 via INTRAVENOUS

## 2014-05-03 NOTE — Progress Notes (Signed)
[facility] NUCLEAR [facility] [address] [place], [place] [address] [phone]    Cardiology Nuclear Med Study  Caleb [name] is a 64 y.o. male     MRN : [id number]     DOB: 11-Apr-1950  Procedure Date: 05/03/2014  Nuclear Med Background Indication for Stress Test:  Evaluation for Ischemia and Follow-Up CAD History: Nonobstructive CAD, and 2014 Myocardial Perfusion Imaging-Normal, EF=59% Cardiac Risk Factors: History of Smoking and Hypertension  Symptoms:Chest Pain with/without exertion (last occurrence 4 days ago), DOE, Fatigue with Exertion, Palpitations and SOB   Nuclear Pre-Procedure Caffeine/Decaff Intake:  None> 12 hrs NPO After: 7:00pm   Lungs:  Diminished breath sounds, no wheezing,O2 Sat: 96% on room air. IV 0.9% NS with Angio Cath:  22g  IV Site: R Hand x 1, tolerated well IV Started by:  Irven Baltimore, RN  Chest Size (in):  40 Cup Size: n/a  Height: 5\' 6"  (1.676 m)  Weight:  144 lb (65.318 kg)  BMI:  Body mass index is 23.25 kg/(m^2). Tech Comments:  Hyzaar this am per patient. Patient took Albuterol Inhaler 2 sprays prior to [employer].Irven Baltimore, RN Aminophylline 75 mg IVP given 7 minutes into recovery due to persistent headache, and marked fatigue with much improvement of symptoms. Irven Baltimore, RN.     Nuclear Med Study 1 or 2 day study: 1 day  Stress Test Type:  Treadmill/Lexiscan  Reading MD: N/A  Order Authorizing Provider:  Sherren Mocha, MD, and Ermalinda Barrios, PAC.  Resting Radionuclide: Technetium 57m Sestamibi  Resting Radionuclide Dose: 11.0 mCi   Stress Radionuclide:  Technetium 54m Sestamibi  Stress Radionuclide Dose: 33.0 mCi           Stress Protocol Rest HR: 62 Stress HR: 108  Rest BP: 112/72 Stress BP: 137/84  Exercise Time (min): 4:00 METS: n/a   Predicted Max HR: 159 bpm % Max HR: 67.92 bpm Rate Pressure Product: 14796   Dose of Adenosine (mg):  n/a Dose of Lexiscan: 0.4 mg  Dose of Atropine (mg): n/a Dose of  Dobutamine: n/a mcg/kg/min (at max HR)  Stress Test Technologist: Irven Baltimore, RN  Nuclear Technologist:  Earl Many, CNMT     Rest Procedure:  Myocardial perfusion imaging was performed at rest 45 minutes following the intravenous administration of Technetium 81m Sestamibi. Rest ECG: NSR - Normal EKG  Stress Procedure: The patient attempted to walk the treadmill utilizing the Bruce Protocol for 2:45 , but was unable to reach target heart rate due to SOB, Back, hip, and leg pain. The patient received IV Lexiscan 0.4 mg over 15-seconds with concurrent low level exercise and then Technetium 42m Sestamibi was injected at 30-seconds while the patient continued walking one more minute.  The patient complained of SOB, marked fatigue, and headache with Lexiscan.Quantitative spect images were obtained after a 45-minute delay. Stress ECG: No significant change from baseline ECG  QPS Raw Data Images:  There is interference from nuclear activity from structures below the diaphragm. This does not affect the ability to read the study. Stress Images:  There is decreased uptake in the inferior wall. Rest Images:  There is decreased uptake in the inferior wall. Subtraction (SDS):  There is a fixed inferior defect that is most consistent with diaphragmatic attenuation. Transient Ischemic Dilatation (Normal <1.22):  0.98 Lung/Heart Ratio (Normal <0.45):  0.31  Quantitative Gated Spect Images QGS EDV:  93 ml QGS ESV:  35 ml  Impression Exercise Capacity:  Lexiscan with low level exercise.  BP Response:  Normal blood pressure response. Clinical Symptoms:  No significant symptoms noted. ECG Impression:  No significant ECG changes with Lexiscan. Comparison with Prior Nuclear Study: No significant change from previous study  Overall Impression:  Low risk stress nuclear study with mild intensity, moderate sized fixed inferior bowel attenuation artifact.  LV Ejection Fraction: 63%.  LV Wall Motion:   Normal Wall Motion   [name], MD, [facility] Board Certified in Nuclear Cardiology Attending Cardiologist [place]

## 2014-06-03 ENCOUNTER — Ambulatory Visit (INDEPENDENT_AMBULATORY_CARE_PROVIDER_SITE_OTHER): Payer: 59 | Admitting: Cardiovascular Disease

## 2014-06-03 ENCOUNTER — Encounter: Payer: Self-pay | Admitting: Cardiovascular Disease

## 2014-06-03 VITALS — BP 115/88 | HR 76 | Ht 66.0 in | Wt 152.0 lb

## 2014-06-03 DIAGNOSIS — R0602 Shortness of breath: Secondary | ICD-10-CM

## 2014-06-03 NOTE — Progress Notes (Signed)
Cardiology Office Note   Date:  06/03/2014   ID:  [name], Caleb December 16, 1950, MRN [id number]  PCP:  Ronita Hipps, MD  Cardiologist:  Sherren Mocha, MD    Chief Complaint  Patient presents with  . Fatigue    discuss stress test results     History of Present Illness: Caleb [name] is a 64 y.o. male who presents today to go over his stress test results. Patient has history of prior cardiac cath in [place] about 10 years ago and in [facility] a few years ago, which demonstrated nonobstructive disease. He had a Myoview in 2013 which revealed EF 58% and no ischemia. 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 PCR and quantiferon gold were negative. He was treated with 4 drug therapy for TB, but was unable to complete the regimen because of medication intolerance.   Today, patient reports his dyspnea and excessive fatigue have continued to worsen over the last 4 months. He reports he becomes SOB even at rest occasionally. He notes dyspnea on exertion, orthopnea, and paroxysmal nocturnal dyspnbea. He states he has undergone PFTs, but never a sleep study. He states he had an episode of chest pain a few days ago that he describes as 4/10 in severity, substernal, non-radiating, sharp, and occurred at rest. He also notes feeling skipped heart beats daily. He wore a Holter monitor in Jan this year which showed sinus rhythm with PACs and a few PVCs, one couplet. He denies leg or abdominal swelling.  Past Medical History  Diagnosis Date  . Hypertension   . Emphysema   . Hyperlipidemia   . Allergic rhinitis   . Coronary artery disease     a. sm blockages on 2007 cath;  b. ETT-MV 1/13:  no ischemia with an EF of 58%;  Lex MV 1/14:  EF 59%, no ischemia  . COPD (chronic obstructive pulmonary disease)     emyphesema  also  . Shortness of breath     DOE  . Recurrent upper respiratory infection (URI)     NOV  & DECEMBER 2012  . GERD (gastroesophageal  reflux disease)   . Headache(784.0)   . HOH (hard of hearing)     BOTH.....Marland KitchenLEFT IS BETTER THEN RIGHT  . Hx of echocardiogram     a. echo 12/13:  EF 50-55%, Gr 1 diast dysfn, mild AI    Past Surgical History  Procedure Laterality Date  . Eye muscle surgery x3      2 left eye, 1 right eye  . Cardiac catheterization      2ND CATH IN 2007 @  [facility]  . Eye surgery    . Back surgery      LOWER WITH FUSION  . Knee surgery, left    . Video bronchoscopy  05/08/2011    Procedure: VIDEO BRONCHOSCOPY;  Surgeon: Grace Isaac, MD;  [facility];  Service: Thoracic;  Laterality: N/A;    Current Outpatient Prescriptions  Medication Sig Dispense Refill  . losartan-hydrochlorothiazide (HYZAAR) 100-12.5 MG per tablet Take 1 tablet by mouth daily.    [name] lovastatin (MEVACOR) 20 MG tablet Take 20 mg by mouth at bedtime.    . Multiple Vitamin (MULITIVITAMIN WITH MINERALS) TABS Take 1 tablet by mouth daily.    . nitroGLYCERIN (NITROSTAT) 0.4 MG SL tablet Place 1 tablet (0.4 mg total) under the tongue every 5 (five) minutes as needed for chest pain. 25 tablet  3  . omeprazole (PRILOSEC) 20 MG capsule Take 1 capsule by mouth daily.    [name] albuterol (PROVENTIL) (2.5 MG/3ML) 0.083% nebulizer solution Take 3 mLs (2.5 mg total) by nebulization 4 (four) times daily. 360 mL 6   No current facility-administered medications for this visit.    Allergies:   Omnipaque; Avelox; and Codeine   Social History:  The patient  reports that he quit smoking about 3 years ago. His smoking use included Cigarettes. He has a 40 pack-year smoking history. His smokeless tobacco use includes Snuff. He reports that he does not drink alcohol or use illicit drugs.   Family History:  The patient's  family history includes Allergies in his brother and sister; Emphysema in his mother; Esophageal cancer in his cousin; Heart attack (age of onset: 42) in his father; Heart disease in his mother; Heart disease (age of onset: 68) in his  sister; Heart failure in his brother and father; Lymphoma in his cousin; Stomach cancer in his cousin.    ROS:  Please see the history of present illness.  Otherwise, review of systems is positive for PND, orthopnea, cough, exertional SOB, back pain, muscle pain, easy bruising  All other systems are reviewed and negative.    PHYSICAL EXAM: VS:  BP 115/88 mmHg  Pulse 76  Ht 5\' 6"  (1.676 m)  Wt 152 lb (68.947 kg)  BMI 24.55 kg/m2  SpO2 97% , BMI There is no weight on file to calculate BMI. GEN: Well nourished, well developed, in no acute distress HEENT: normal Neck: no JVD, no masses, no carotid bruits Cardiac: RRR without murmur or gallop                Respiratory:  clear to auscultation bilaterally, mildly prolonged expiratory phase, normal work of breathing GI: soft, nontender, nondistended, + BS MS: no deformity or atrophy Ext: no pretibial edema Skin: warm and dry, no rash Neuro:  Strength and sensation are intact Psych: euthymic mood, full affect  EKG:  EKG is not ordered today.  Recent Labs: No results found for requested labs within last 365 days.   Lipid Panel  No results found for: CHOL, TRIG, HDL, CHOLHDL, VLDL, LDLCALC, LDLDIRECT    Wt Readings from Last 3 Encounters:  05/03/14 144 lb (65.318 kg)  04/25/14 152 lb (68.947 kg)  05/28/12 144 lb (65.318 kg)     Cardiac Studies Reviewed: Myoview 05/03/14: low risk study with mild intensity, moderate size fixed inferior bowel attenuation artifact. LVEF 63% with normal wall motion.  ASSESSMENT AND PLAN: 1. Dyspnea/Fatigue: Does not appear to be cardiac-related. The patient had a normal stress nuclear scan. He has no history of LV dysfunction and in fact his LVEF is within normal limits by nuclear study criteria. He has no history of valvular heart disease and no murmur on exam. His physical exam reveals no evidence of volume overload. We discussed consideration of an echocardiogram, but this seems fairly low yield. He  prefers to avoid further tests secondary to cost. I suspect his shortness of breath is primarily of a pulmonary etiology. He has quit smoking, but has a history of heavy tobacco for many years. He will follow-up in one year.  2. Essential hypertension. Blood pressure is controlled on losartan-hydrochlorothiazide  3. Hyperlipidemia: Treated with lovastatin  Current medicines are reviewed with the patient today.  The patient does not have concerns regarding medicines.  The following changes have been made:  no change  Labs/ tests ordered today include:  No orders of the defined types were placed in this encounter.   Disposition:   FU in 1 year  Signed, Sherren Mocha, MD  06/03/2014 9:34 AM    [address], [place], [place]  [address] Phone: [phone]; Fax: [phone]

## 2014-06-03 NOTE — Patient Instructions (Signed)
Your physician recommends that you continue on your current medications as directed. Please refer to the Current Medication list given to you today.  Your physician wants you to follow-up in: 1 year with Dr. Cooper.  You will receive a reminder letter in the mail two months in advance. If you don't receive a letter, please call our office to schedule the follow-up appointment.   

## 2014-06-05 ENCOUNTER — Encounter: Payer: Self-pay | Admitting: Cardiovascular Disease

## 2015-08-30 ENCOUNTER — Other Ambulatory Visit: Payer: Self-pay

## 2015-08-30 ENCOUNTER — Ambulatory Visit (HOSPITAL_COMMUNITY): Payer: 59 | Attending: Cardiovascular Disease

## 2015-08-30 ENCOUNTER — Encounter: Payer: Self-pay | Admitting: Cardiovascular Disease

## 2015-08-30 ENCOUNTER — Ambulatory Visit (INDEPENDENT_AMBULATORY_CARE_PROVIDER_SITE_OTHER): Payer: 59 | Admitting: Cardiovascular Disease

## 2015-08-30 VITALS — BP 130/88 | HR 72 | Ht 65.0 in | Wt 147.1 lb

## 2015-08-30 DIAGNOSIS — I351 Nonrheumatic aortic (valve) insufficiency: Secondary | ICD-10-CM | POA: Insufficient documentation

## 2015-08-30 DIAGNOSIS — I10 Essential (primary) hypertension: Secondary | ICD-10-CM | POA: Diagnosis not present

## 2015-08-30 DIAGNOSIS — I2510 Atherosclerotic heart disease of native coronary artery without angina pectoris: Secondary | ICD-10-CM | POA: Diagnosis not present

## 2015-08-30 DIAGNOSIS — R0602 Shortness of breath: Secondary | ICD-10-CM

## 2015-08-30 DIAGNOSIS — R0600 Dyspnea, unspecified: Secondary | ICD-10-CM | POA: Diagnosis present

## 2015-08-30 LAB — ECHOCARDIOGRAM COMPLETE
HEIGHTINCHES: 65 in
WEIGHTICAEL: 2353.92 [oz_av]

## 2015-08-30 MED ORDER — ASPIRIN 81 MG PO TABS: 81.0000 mg | ORAL_TABLET | Freq: Every day | ORAL | Status: AC

## 2015-08-30 NOTE — Patient Instructions (Signed)
Medication Instructions:  Your physician has recommended you make the following change in your medication:  1. DECREASE Aspirin to 81mg take one tablet by mouth daily  Labwork: No new orders.   Testing/Procedures: Your physician has requested that you have an echocardiogram. Echocardiography is a painless test that uses sound waves to create images of your heart. It provides your doctor with information about the size and shape of your heart and how well your heart's chambers and valves are working. This procedure takes approximately one hour. There are no restrictions for this procedure.  Follow-Up: Your physician wants you to follow-up in: 1 YEAR with Dr Cooper. You will receive a reminder letter in the mail two months in advance. If you don't receive a letter, please call our office to schedule the follow-up appointment.   Any Other Special Instructions Will Be Listed Below (If Applicable).     If you need a refill on your cardiac medications before your next appointment, please call your pharmacy.   

## 2015-08-30 NOTE — Progress Notes (Signed)
Cardiology Office Note Date:  08/30/2015   ID:  Caleb, [name] May 14, 1950, MRN [id number]  PCP:  Ronita Hipps, MD  Cardiologist:  Sherren Mocha, MD    Chief Complaint  Patient presents with  . essential hypertension    C/O shortness of breath. Denies cp,lee,or claudication  . Hyperlipidemia     History of Present Illness: Caleb [name] is a 65 y.o. male who presents for follow-up evaluation.   He has a lot of trouble with his breathing. He has to sit and rest frequently, even with light work. 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 He has a 90 pack year history of tobacco and is treated for COPD. He sees Dr MIles at [facility] - notes reviewed through [employer].  He had a CT of the chest in 2014 - this showed dense calcification of the LAD and circumflex. A nuclear scan last year showed no ischemia.   The patient is limited by dyspnea. At times he has palpitations. No chest pain or pressure. Trying to spread out Spiriva because of cost. No edema, orthopnea, or PND.  Past Medical History  Diagnosis Date  . Hypertension   . Emphysema   . Hyperlipidemia   . Allergic rhinitis   . Coronary artery disease     a. sm blockages on 2007 cath;  b. ETT-MV 1/13:  no ischemia with an EF of 58%;  Lex MV 1/14:  EF 59%, no ischemia  . COPD (chronic obstructive pulmonary disease) ([place])     emyphesema  also  . Shortness of breath     DOE  . Recurrent upper respiratory infection (URI)     NOV  & DECEMBER 2012  . GERD (gastroesophageal reflux disease)   . Headache(784.0)   . HOH (hard of hearing)     BOTH.....Marland KitchenLEFT IS BETTER THEN RIGHT  . Hx of echocardiogram     a. echo 12/13:  EF 50-55%, Gr 1 diast dysfn, mild AI    Past Surgical History  Procedure Laterality Date  . Eye muscle surgery x3      2 left eye, 1 right eye  . Cardiac catheterization      2ND CATH IN 2007 @  HIGH PT  .  Eye surgery    . Back surgery      LOWER WITH FUSION  . Knee surgery, left    . Video bronchoscopy  05/08/2011    Procedure: VIDEO BRONCHOSCOPY;  Surgeon: Grace Isaac, MD;  [facility];  Service: Thoracic;  Laterality: N/A;    Current Outpatient Prescriptions  Medication Sig Dispense Refill  . aspirin 81 MG tablet Take 1 tablet (81 mg total) by mouth daily. 30 tablet   . levothyroxine (SYNTHROID, LEVOTHROID) 125 MCG tablet Take 125 mcg by mouth daily before breakfast.    . losartan-hydrochlorothiazide (HYZAAR) 100-12.5 MG per tablet Take 1 tablet by mouth daily.    [name] lovastatin (MEVACOR) 20 MG tablet Take 20 mg by mouth at bedtime.    . Multiple Vitamin (MULITIVITAMIN WITH MINERALS) TABS Take 1 tablet by mouth daily.    . nitroGLYCERIN (NITROSTAT) 0.4 MG SL tablet Place 1 tablet (0.4 mg total) under the tongue every 5 (five) minutes as needed for chest pain. 25 tablet 3  . omeprazole (PRILOSEC) 20 MG capsule Take 1 capsule by mouth daily.    [name] albuterol (PROVENTIL) (2.5 MG/3ML) 0.083% nebulizer solution Take 3  mLs (2.5 mg total) by nebulization 4 (four) times daily. 360 mL 6   No current facility-administered medications for this visit.    Allergies:   Omnipaque; Moxifloxacin; Avelox; and Codeine   Social History:  The patient  reports that he quit smoking about 4 years ago. His smoking use included Cigarettes. He has a 40 pack-year smoking history. His smokeless tobacco use includes Snuff. He reports that he does not drink alcohol or use illicit drugs.   Family History:  The patient's family history includes Allergies in his brother and sister; Emphysema in his mother; Esophageal cancer in his cousin; Heart attack (age of onset: 70) in his father; Heart disease in his mother; Heart disease (age of onset: 43) in his sister; Heart failure in his brother and father; Lymphoma in his cousin; Stomach cancer in his cousin.    ROS:  Please see the history of present illness.  Otherwise,  review of systems is positive for cough, exertional dyspnea, back pain, muscle pain, easy bruising, excessive fatigue, palpitations, joint swelling, headaches.  All other systems are reviewed and negative.    PHYSICAL EXAM: VS:  BP 130/88 mmHg  Pulse 72  Ht 5\' 5"  (1.651 m)  Wt 147 lb 1.9 oz (66.733 kg)  BMI 24.48 kg/m2 , BMI Body mass index is 24.48 kg/(m^2). GEN: Well nourished, well developed, in no acute distress HEENT: normal Neck: no JVD, no masses. No carotid bruits Cardiac: RRR without murmur or gallop                Respiratory:  clear to auscultation bilaterally, prolonged expiratory phase GI: soft, nontender, nondistended, + BS MS: no deformity or atrophy Ext: no pretibial edema, pedal pulses 2+= bilaterally Skin: warm and dry, no rash Neuro:  Strength and sensation are intact Psych: euthymic mood, full affect  EKG:  EKG is ordered today. The ekg ordered today shows NSr 67 bpm, nonspecific ST change  Recent Labs: No results found for requested labs within last 365 days.   Lipid Panel  No results found for: CHOL, TRIG, HDL, CHOLHDL, VLDL, LDLCALC, LDLDIRECT    Wt Readings from Last 3 Encounters:  08/30/15 147 lb 1.9 oz (66.733 kg)  06/03/14 152 lb (68.947 kg)  05/03/14 144 lb (65.318 kg)     Cardiac Studies Reviewed: Myoview Scan 05-03-2014: Impression Exercise Capacity: Lexiscan with low level exercise. BP Response: Normal blood pressure response. Clinical Symptoms: No significant symptoms noted. ECG Impression: No significant ECG changes with Lexiscan. Comparison with Prior Nuclear Study: No significant change from previous study  Overall Impression: Low risk stress nuclear study with mild intensity, moderate sized fixed inferior bowel attenuation artifact.  LV Ejection Fraction: 63%. LV Wall Motion: Normal Wall Motion   ASSESSMENT AND PLAN: 1.  CAD, native vessel, without angina: dx based on incidental coronary Ca++ on CT chest. Negative perfusion  scan last year. Continue ASA and statin.  2. Essential HTN: controlled on current rx. meds reviewed.  3. Dyspnea, suspect pulmonary-related with COPD, but should have echo assessment to evaluate for diastolic heart disease, valvular function, pulmonary HTN, etc. Will order.   Current medicines are reviewed with the patient today.  The patient does not have concerns regarding medicines.  Labs/ tests ordered today include:   Orders Placed This Encounter  Procedures  . EKG 12-Lead  . ECHOCARDIOGRAM COMPLETE    Disposition:   FU one year  Signed, Sherren Mocha, MD  08/30/2015 1:40 PM    [facility]  [address] [place], [place]  [address] Phone: [phone]; Fax: [phone]

## 2015-09-03 ENCOUNTER — Emergency Department (HOSPITAL_COMMUNITY)
Admission: EM | Admit: 2015-09-03 | Discharge: 2015-09-04 | Disposition: A | Discharge status: Home / Self Care | Payer: 59 | Attending: Emergency Medicine | Admitting: Emergency Medicine

## 2015-09-03 ENCOUNTER — Encounter (HOSPITAL_COMMUNITY): Payer: Self-pay | Admitting: Emergency Medicine

## 2015-09-03 DIAGNOSIS — Y9389 Activity, other specified: Secondary | ICD-10-CM | POA: Diagnosis not present

## 2015-09-03 DIAGNOSIS — S80852A Superficial foreign body, left lower leg, initial encounter: Secondary | ICD-10-CM | POA: Diagnosis not present

## 2015-09-03 DIAGNOSIS — J439 Emphysema, unspecified: Secondary | ICD-10-CM | POA: Insufficient documentation

## 2015-09-03 DIAGNOSIS — X58XXXA Exposure to other specified factors, initial encounter: Secondary | ICD-10-CM | POA: Diagnosis not present

## 2015-09-03 DIAGNOSIS — S81812A Laceration without foreign body, left lower leg, initial encounter: Secondary | ICD-10-CM | POA: Insufficient documentation

## 2015-09-03 DIAGNOSIS — Z87891 Personal history of nicotine dependence: Secondary | ICD-10-CM | POA: Diagnosis not present

## 2015-09-03 DIAGNOSIS — Y998 Other external cause status: Secondary | ICD-10-CM | POA: Diagnosis not present

## 2015-09-03 DIAGNOSIS — Y9289 Other specified places as the place of occurrence of the external cause: Secondary | ICD-10-CM | POA: Diagnosis not present

## 2015-09-03 DIAGNOSIS — K219 Gastro-esophageal reflux disease without esophagitis: Secondary | ICD-10-CM | POA: Diagnosis not present

## 2015-09-03 DIAGNOSIS — Z79899 Other long term (current) drug therapy: Secondary | ICD-10-CM | POA: Diagnosis not present

## 2015-09-03 DIAGNOSIS — IMO0002 Reserved for concepts with insufficient information to code with codable children: Secondary | ICD-10-CM

## 2015-09-03 DIAGNOSIS — I10 Essential (primary) hypertension: Secondary | ICD-10-CM | POA: Diagnosis not present

## 2015-09-03 DIAGNOSIS — Z7982 Long term (current) use of aspirin: Secondary | ICD-10-CM | POA: Diagnosis not present

## 2015-09-03 DIAGNOSIS — H9190 Unspecified hearing loss, unspecified ear: Secondary | ICD-10-CM | POA: Insufficient documentation

## 2015-09-03 DIAGNOSIS — Z9889 Other specified postprocedural states: Secondary | ICD-10-CM | POA: Insufficient documentation

## 2015-09-03 DIAGNOSIS — I2510 Atherosclerotic heart disease of native coronary artery without angina pectoris: Secondary | ICD-10-CM | POA: Insufficient documentation

## 2015-09-03 DIAGNOSIS — M795 Residual foreign body in soft tissue: Secondary | ICD-10-CM

## 2015-09-03 MED ORDER — TETANUS-DIPHTH-ACELL PERTUSSIS 5-2.5-18.5 LF-MCG/0.5 IM SUSP
0.5000 mL | Freq: Once | INTRAMUSCULAR | Status: AC
  Administered 2015-09-04: 0.5 mL via INTRAMUSCULAR
  Filled 2015-09-03: qty 0.5
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
## 2015-09-04 ENCOUNTER — Emergency Department (HOSPITAL_COMMUNITY): Payer: 59

## 2015-09-04 DIAGNOSIS — S81812A Laceration without foreign body, left lower leg, initial encounter: Secondary | ICD-10-CM | POA: Diagnosis not present

## 2015-09-04 MED ORDER — LIDOCAINE-EPINEPHRINE 1 %-1:100000 IJ SOLN
10.0000 mL | Freq: Once | INTRAMUSCULAR | Status: AC
  Administered 2015-09-04: 10 mL via INTRADERMAL
  Filled 2015-09-04: qty 1

## 2015-09-04 MED ORDER — HYDROCODONE-ACETAMINOPHEN 5-325 MG PO TABS
2.0000 | ORAL_TABLET | Freq: Once | ORAL | Status: AC
  Administered 2015-09-04: 2 via ORAL
  Filled 2015-09-04: qty 2

## 2015-09-04 NOTE — ED Notes (Signed)
Paediatric nurse at bedside.

## 2015-09-04 NOTE — Discharge Instructions (Signed)
Laceration Care, Adult Caleb [name], the largest glass piece was removed from your leg.  The smaller glass pieces will work its way out over time.  Keep the wound clean with soap and water twice per day.  See a doctor within 3 days for wound check.  If any symptoms worsen, come back to the ED immediately. Thank you.     A laceration is a cut that goes through all layers of the skin. The cut also goes into the tissue that is right under the skin. Some cuts heal on their own. Others need to be closed with stitches (sutures), staples, skin adhesive strips, or wound glue. Taking care of your cut lowers your risk of infection and helps your cut to heal better. HOW TO TAKE CARE OF YOUR CUT For stitches or staples:  Keep the wound clean and dry.  If you were given a bandage (dressing), you should change it at least one time per day or as told by your doctor. You should also change it if it gets wet or dirty.  Keep the wound completely dry for the first 24 hours or as told by your doctor. After that time, you may take a shower or a bath. However, make sure that the wound is not soaked in water until after the stitches or staples have been removed.  Clean the wound one time each day or as told by your doctor:  Wash the wound with soap and water.  Rinse the wound with water until all of the soap comes off.  Pat the wound dry with a clean towel. Do not rub the wound.  After you clean the wound, put a thin layer of antibiotic ointment on it as told by your doctor. This ointment:  Helps to prevent infection.  Keeps the bandage from sticking to the wound.  Have your stitches or staples removed as told by your doctor. If your doctor used skin adhesive strips:   Keep the wound clean and dry.  If you were given a bandage, you should change it at least one time per day or as told by your doctor. You should also change it if it gets dirty or wet.  Do not get the skin adhesive strips wet. You can take a  shower or a bath, but be careful to keep the wound dry.  If the wound gets wet, pat it dry with a clean towel. Do not rub the wound.  Skin adhesive strips fall off on their own. You can trim the strips as the wound heals. Do not remove any strips that are still stuck to the wound. They will fall off after a while. If your doctor used wound glue:  Try to keep your wound dry, but you may briefly wet it in the shower or bath. Do not soak the wound in water, such as by swimming.  After you take a shower or a bath, gently pat the wound dry with a clean towel. Do not rub the wound.  Do not do any activities that will make you really sweaty until the skin glue has fallen off on its own.  Do not apply liquid, cream, or ointment medicine to your wound while the skin glue is still on.  If you were given a bandage, you should change it at least one time per day or as told by your doctor. You should also change it if it gets dirty or wet.  If a bandage is placed over the wound, do  not let the tape for the bandage touch the skin glue.  Do not pick at the glue. The skin glue usually stays on for 5-10 days. Then, it falls off of the skin. General Instructions  To help prevent scarring, make sure to cover your wound with sunscreen whenever you are outside after stitches are removed, after adhesive strips are removed, or when wound glue stays in place and the wound is healed. Make sure to wear a sunscreen of at least 30 SPF.  Take over-the-counter and prescription medicines only as told by your doctor.  If you were given antibiotic medicine or ointment, take or apply it as told by your doctor. Do not stop using the antibiotic even if your wound is getting better.  Do not scratch or pick at the wound.  Keep all follow-up visits as told by your doctor. This is important.  Check your wound every day for signs of infection. Watch for:  Redness, swelling, or pain.  Fluid, blood, or pus.  Raise  (elevate) the injured area above the level of your heart while you are sitting or lying down, if possible. GET HELP IF:  You got a tetanus shot and you have any of these problems at the injection site:  Swelling.  Very bad pain.  Redness.  Bleeding.  You have a fever.  A wound that was closed breaks open.  You notice a bad smell coming from your wound or your bandage.  You notice something coming out of the wound, such as wood or glass.  Medicine does not help your pain.  You have more redness, swelling, or pain at the site of your wound.  You have fluid, blood, or pus coming from your wound.  You notice a change in the color of your skin near your wound.  You need to change the bandage often because fluid, blood, or pus is coming from the wound.  You start to have a new rash.  You start to have numbness around the wound. GET HELP RIGHT AWAY IF:  You have very bad swelling around the wound.  Your pain suddenly gets worse and is very bad.  You notice painful lumps near the wound or on skin that is anywhere on your body.  You have a red streak going away from your wound.  The wound is on your hand or foot and you cannot move a finger or toe like you usually can.  The wound is on your hand or foot and you notice that your fingers or toes look pale or bluish.   This information is not intended to replace advice given to you by your health care provider. Make sure you discuss any questions you have with your health care provider.   Document Released: 09/04/2007 Document Revised: 08/02/2014 Document Reviewed: 03/14/2014 Elsevier Interactive Patient Education [employer].

## 2015-09-04 NOTE — ED Notes (Signed)
Patient left at this time with all belongings. Refused wheelchair 

## 2016-02-13 ENCOUNTER — Encounter: Payer: Self-pay | Admitting: Physician Assistant

## 2016-02-13 ENCOUNTER — Encounter: Payer: Self-pay | Admitting: *Deleted

## 2016-02-13 ENCOUNTER — Ambulatory Visit (INDEPENDENT_AMBULATORY_CARE_PROVIDER_SITE_OTHER): Payer: 59 | Admitting: Physician Assistant

## 2016-02-13 VITALS — BP 132/88 | HR 68 | Ht 65.0 in | Wt 157.1 lb

## 2016-02-13 DIAGNOSIS — I10 Essential (primary) hypertension: Secondary | ICD-10-CM | POA: Diagnosis not present

## 2016-02-13 DIAGNOSIS — Z01812 Encounter for preprocedural laboratory examination: Secondary | ICD-10-CM | POA: Diagnosis not present

## 2016-02-13 DIAGNOSIS — E784 Other hyperlipidemia: Secondary | ICD-10-CM

## 2016-02-13 DIAGNOSIS — E7849 Other hyperlipidemia: Secondary | ICD-10-CM

## 2016-02-13 DIAGNOSIS — I200 Unstable angina: Secondary | ICD-10-CM

## 2016-02-13 LAB — BASIC METABOLIC PANEL
BUN: 14 mg/dL (ref 7–25)
CHLORIDE: 101 mmol/L (ref 98–110)
CO2: 28 mmol/L (ref 20–31)
Calcium: 9.4 mg/dL (ref 8.6–10.3)
Creat: 1.09 mg/dL (ref 0.70–1.25)
Glucose, Bld: 96 mg/dL (ref 65–99)
POTASSIUM: 3.9 mmol/L (ref 3.5–5.3)
SODIUM: 139 mmol/L (ref 135–146)

## 2016-02-13 LAB — CBC
HEMATOCRIT: 41.3 % (ref 38.5–50.0)
HEMOGLOBIN: 14.1 g/dL (ref 13.2–17.1)
MCH: 31.1 pg (ref 27.0–33.0)
MCHC: 34.1 g/dL (ref 32.0–36.0)
MCV: 91 fL (ref 80.0–100.0)
MPV: 9.8 fL (ref 7.5–12.5)
Platelets: 300 10*3/uL (ref 140–400)
RBC: 4.54 MIL/uL (ref 4.20–5.80)
RDW: 13.3 % (ref 11.0–15.0)
WBC: 6.9 10*3/uL (ref 3.8–10.8)

## 2016-02-13 LAB — PROTIME-INR
INR: 1
Prothrombin Time: 10.5 s (ref 9.0–11.5)

## 2016-02-13 NOTE — Patient Instructions (Signed)
Medication Instructions:  None  Labwork: BMET, CBC and INR today  Testing/Procedures: Your physician has requested that you have a cardiac catheterization. Cardiac catheterization is used to diagnose and/or treat various heart conditions. Doctors may recommend this procedure for a number of different reasons. The most common reason is to evaluate chest pain. Chest pain can be a symptom of coronary artery disease (CAD), and cardiac catheterization can show whether plaque is narrowing or blocking your heart's arteries. This procedure is also used to evaluate the valves, as well as measure the blood flow and oxygen levels in different parts of your heart. For further information please visit [facility]uge[facility]iesta[facility]tn. Please follow instruction sheet, as given.    Follow-Up: Follow up will be decided once heart catheterization is completed.  Any Other Special Instructions Will Be Listed Below (If Applicable).     If you need a refill on your cardiac medications before your next appointment, please call your pharmacy.
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
## 2016-02-14 ENCOUNTER — Ambulatory Visit (HOSPITAL_COMMUNITY)
Admission: RE | Admit: 2016-02-14 | Discharge: 2016-02-14 | Disposition: A | Discharge status: Home / Self Care | Payer: 59 | Source: Ambulatory Visit | Attending: Interventional Cardiology | Admitting: Interventional Cardiology

## 2016-02-14 ENCOUNTER — Encounter (HOSPITAL_COMMUNITY): Payer: Self-pay | Admitting: *Deleted

## 2016-02-14 ENCOUNTER — Ambulatory Visit: Payer: 59 | Admitting: Physician Assistant

## 2016-02-14 ENCOUNTER — Encounter (HOSPITAL_COMMUNITY)
Admission: RE | Disposition: A | Discharge status: Home / Self Care | Payer: Self-pay | Source: Ambulatory Visit | Attending: Interventional Cardiology

## 2016-02-14 DIAGNOSIS — I25119 Atherosclerotic heart disease of native coronary artery with unspecified angina pectoris: Secondary | ICD-10-CM | POA: Diagnosis not present

## 2016-02-14 DIAGNOSIS — K219 Gastro-esophageal reflux disease without esophagitis: Secondary | ICD-10-CM | POA: Diagnosis not present

## 2016-02-14 DIAGNOSIS — J410 Simple chronic bronchitis: Secondary | ICD-10-CM

## 2016-02-14 DIAGNOSIS — E785 Hyperlipidemia, unspecified: Secondary | ICD-10-CM | POA: Insufficient documentation

## 2016-02-14 DIAGNOSIS — R0789 Other chest pain: Secondary | ICD-10-CM | POA: Diagnosis present

## 2016-02-14 DIAGNOSIS — Z79899 Other long term (current) drug therapy: Secondary | ICD-10-CM | POA: Insufficient documentation

## 2016-02-14 DIAGNOSIS — J439 Emphysema, unspecified: Secondary | ICD-10-CM | POA: Diagnosis not present

## 2016-02-14 DIAGNOSIS — I10 Essential (primary) hypertension: Secondary | ICD-10-CM | POA: Insufficient documentation

## 2016-02-14 DIAGNOSIS — R002 Palpitations: Secondary | ICD-10-CM | POA: Diagnosis present

## 2016-02-14 DIAGNOSIS — Z87891 Personal history of nicotine dependence: Secondary | ICD-10-CM | POA: Insufficient documentation

## 2016-02-14 DIAGNOSIS — Z7982 Long term (current) use of aspirin: Secondary | ICD-10-CM | POA: Diagnosis not present

## 2016-02-14 DIAGNOSIS — I2510 Atherosclerotic heart disease of native coronary artery without angina pectoris: Secondary | ICD-10-CM | POA: Diagnosis present

## 2016-02-14 DIAGNOSIS — Z8249 Family history of ischemic heart disease and other diseases of the circulatory system: Secondary | ICD-10-CM | POA: Insufficient documentation

## 2016-02-14 DIAGNOSIS — J449 Chronic obstructive pulmonary disease, unspecified: Secondary | ICD-10-CM | POA: Diagnosis present

## 2016-02-14 DIAGNOSIS — I25110 Atherosclerotic heart disease of native coronary artery with unstable angina pectoris: Secondary | ICD-10-CM | POA: Insufficient documentation

## 2016-02-14 HISTORY — PX: CARDIAC CATHETERIZATION: SHX172

## 2016-02-14 SURGERY — LEFT HEART CATH AND CORONARY ANGIOGRAPHY

## 2016-02-14 MED ORDER — MIDAZOLAM HCL 2 MG/2ML IJ SOLN
INTRAMUSCULAR | Status: DC | PRN
  Administered 2016-02-14: 1 mg via INTRAVENOUS

## 2016-02-14 MED ORDER — METHYLPREDNISOLONE SODIUM SUCC 125 MG IJ SOLR
125.0000 mg | Freq: Once | INTRAMUSCULAR | Status: AC
  Administered 2016-02-14: 125 mg via INTRAVENOUS

## 2016-02-14 MED ORDER — FENTANYL CITRATE (PF) 100 MCG/2ML IJ SOLN
INTRAMUSCULAR | Status: AC
  Filled 2016-02-14: qty 2

## 2016-02-14 MED ORDER — LIDOCAINE HCL (PF) 1 % IJ SOLN
INTRAMUSCULAR | Status: DC | PRN
  Administered 2016-02-14: 2 mL

## 2016-02-14 MED ORDER — IOPAMIDOL (ISOVUE-370) INJECTION 76%
INTRAVENOUS | Status: DC | PRN
  Administered 2016-02-14: 50 mL via INTRA_ARTERIAL

## 2016-02-14 MED ORDER — FAMOTIDINE IN NACL 20-0.9 MG/50ML-% IV SOLN
INTRAVENOUS | Status: AC
  Administered 2016-02-14: 20 mg via INTRAVENOUS
  Filled 2016-02-14: qty 50

## 2016-02-14 MED ORDER — ONDANSETRON HCL 4 MG/2ML IJ SOLN: 4.0000 mg | Freq: Four times a day (QID) | INTRAMUSCULAR | Status: DC | PRN

## 2016-02-14 MED ORDER — SODIUM CHLORIDE 0.9 % IV SOLN: 250.0000 mL | INTRAVENOUS | Status: DC | PRN

## 2016-02-14 MED ORDER — FAMOTIDINE IN NACL 20-0.9 MG/50ML-% IV SOLN
20.0000 mg | Freq: Once | INTRAVENOUS | Status: AC
  Administered 2016-02-14: 20 mg via INTRAVENOUS

## 2016-02-14 MED ORDER — VERAPAMIL HCL 2.5 MG/ML IV SOLN
INTRAVENOUS | Status: DC | PRN
  Administered 2016-02-14: 13:00:00 via INTRA_ARTERIAL

## 2016-02-14 MED ORDER — HEPARIN SODIUM (PORCINE) 1000 UNIT/ML IJ SOLN
INTRAMUSCULAR | Status: DC | PRN
  Administered 2016-02-14: 3500 [IU] via INTRAVENOUS

## 2016-02-14 MED ORDER — FENTANYL CITRATE (PF) 100 MCG/2ML IJ SOLN
INTRAMUSCULAR | Status: DC | PRN
  Administered 2016-02-14: 50 ug via INTRAVENOUS

## 2016-02-14 MED ORDER — SODIUM CHLORIDE 0.9 % WEIGHT BASED INFUSION: 3.0000 mL/kg/h | INTRAVENOUS | Status: AC

## 2016-02-14 MED ORDER — SODIUM CHLORIDE 0.9 % WEIGHT BASED INFUSION: 1.0000 mL/kg/h | INTRAVENOUS | Status: DC

## 2016-02-14 MED ORDER — HEPARIN (PORCINE) IN NACL 2-0.9 UNIT/ML-% IJ SOLN
INTRAMUSCULAR | Status: DC | PRN
  Administered 2016-02-14: 1000 mL

## 2016-02-14 MED ORDER — VERAPAMIL HCL 2.5 MG/ML IV SOLN
INTRAVENOUS | Status: AC
  Filled 2016-02-14: qty 2

## 2016-02-14 MED ORDER — SODIUM CHLORIDE 0.9% FLUSH: 3.0000 mL | INTRAVENOUS | Status: DC | PRN

## 2016-02-14 MED ORDER — SODIUM CHLORIDE 0.9% FLUSH: 3.0000 mL | Freq: Two times a day (BID) | INTRAVENOUS | Status: DC

## 2016-02-14 MED ORDER — SODIUM CHLORIDE 0.9 % WEIGHT BASED INFUSION
3.0000 mL/kg/h | INTRAVENOUS | Status: AC
Start: 2016-02-14 — End: 2016-02-14
  Administered 2016-02-14: 3 mL/kg/h via INTRAVENOUS

## 2016-02-14 MED ORDER — LIDOCAINE HCL (PF) 1 % IJ SOLN
INTRAMUSCULAR | Status: AC
Start: 2016-02-14 — End: 2016-02-14
  Filled 2016-02-14: qty 30

## 2016-02-14 MED ORDER — DIPHENHYDRAMINE HCL 50 MG/ML IJ SOLN
INTRAMUSCULAR | Status: AC
  Administered 2016-02-14: 25 mg via INTRAVENOUS
  Filled 2016-02-14: qty 1

## 2016-02-14 MED ORDER — DIPHENHYDRAMINE HCL 50 MG/ML IJ SOLN
25.0000 mg | Freq: Once | INTRAMUSCULAR | Status: AC
  Administered 2016-02-14: 25 mg via INTRAVENOUS

## 2016-02-14 MED ORDER — METHYLPREDNISOLONE SODIUM SUCC 125 MG IJ SOLR
INTRAMUSCULAR | Status: AC
  Administered 2016-02-14: 125 mg via INTRAVENOUS
  Filled 2016-02-14: qty 2

## 2016-02-14 MED ORDER — ACETAMINOPHEN 325 MG PO TABS: 650.0000 mg | ORAL_TABLET | ORAL | Status: DC | PRN

## 2016-02-14 MED ORDER — HEPARIN SODIUM (PORCINE) 1000 UNIT/ML IJ SOLN
INTRAMUSCULAR | Status: AC
  Filled 2016-02-14: qty 1

## 2016-02-14 MED ORDER — MIDAZOLAM HCL 2 MG/2ML IJ SOLN
INTRAMUSCULAR | Status: AC
  Filled 2016-02-14: qty 2

## 2016-02-14 MED ORDER — HEPARIN (PORCINE) IN NACL 2-0.9 UNIT/ML-% IJ SOLN
INTRAMUSCULAR | Status: AC
  Filled 2016-02-14: qty 1000

## 2016-02-14 MED ORDER — IOPAMIDOL (ISOVUE-370) INJECTION 76%
INTRAVENOUS | Status: AC
  Filled 2016-02-14: qty 100

## 2016-02-14 MED ORDER — ASPIRIN 81 MG PO CHEW: 81.0000 mg | CHEWABLE_TABLET | ORAL | Status: DC

## 2016-02-14 SURGICAL SUPPLY — 10 items
CATH INFINITI 5 FR JL3.5 (CATHETERS) ×2 IMPLANT
CATH INFINITI JR4 5F (CATHETERS) ×2 IMPLANT
DEVICE RAD COMP TR BAND LRG (VASCULAR PRODUCTS) ×2 IMPLANT
GLIDESHEATH SLEND A-KIT 6F 22G (SHEATH) ×2 IMPLANT
GUIDEWIRE INQWIRE 1.5J.035X260 (WIRE) ×1 IMPLANT
INQWIRE 1.5J .035X260CM (WIRE) ×2
KIT HEART LEFT (KITS) ×2 IMPLANT
PACK CARDIAC CATHETERIZATION (CUSTOM PROCEDURE TRAY) ×2 IMPLANT
TRANSDUCER W/STOPCOCK (MISCELLANEOUS) ×2 IMPLANT
TUBING CIL FLEX 10 FLL-RA (TUBING) ×2 IMPLANT

## 2016-02-14 NOTE — Discharge Instructions (Signed)
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
## 2016-02-14 NOTE — Interval H&P Note (Signed)
Cath Lab Visit (complete for each Cath Lab visit)  Clinical Evaluation Leading to the Procedure:   ACS: Yes.    Non-ACS:    Anginal Classification: CCS III  Anti-ischemic medical therapy: Minimal Therapy (1 class of medications)  Non-Invasive Test Results: No non-invasive testing performed  Prior CABG: No previous CABG      History and Physical Interval Note:  02/14/2016 12:48 PM  Caleb [name]  has presented today for surgery, with the diagnosis of unstable angina  The various methods of treatment have been discussed with the patient and family. After consideration of risks, benefits and other options for treatment, the patient has consented to  Procedure(s): Left Heart Cath and Coronary Angiography (N/A) as a surgical intervention .  The patient's history has been reviewed, patient examined, no change in status, stable for surgery.  I have reviewed the patient's chart and labs.  Questions were answered to the patient's satisfaction.     Belva Crome III

## 2016-02-15 ENCOUNTER — Encounter (HOSPITAL_COMMUNITY): Payer: Self-pay | Admitting: Interventional Cardiology

## 2016-02-15 ENCOUNTER — Telehealth: Payer: Self-pay | Admitting: Physician Assistant

## 2016-02-15 NOTE — Telephone Encounter (Signed)
Dr Burt Knack this is a pt that you saw on 08/30/15.  The pt was seen by Vin 02/13/16 in regards to needing surgical clearance (pre-op work showed abnormal EKG) for repair of Left knee meniscus.  Vin scheduled the pt for cath and this was performed on 02/14/16 by Dr Tamala Julian.   Findings:  The left ventricular systolic function is normal.  LV end diastolic pressure is normal.  The left ventricular ejection fraction is 55-65% by visual estimate.  Ost Cx lesion, 30 %stenosed.  Ost LAD to Prox LAD lesion, 45 %stenosed.  Ost 2nd Diag to 2nd Diag lesion, 50 %stenosed.  Ost 1st Diag to 1st Diag lesion, 30 %stenosed.    Luminal irregularities with up to 30% narrowing in the proximal segment of the LAD. The first and second diagonal branches contain 50% or less ostial narrowing.  The circumflex and right coronary all without any significant obstruction.  Left dominant coronary anatomy.  Normal left ventricular systolic function with EF 65%.  RECOMMENDATIONS:   Overall, no explanation for the episodes of discomfort or T-wave abnormality noted on EKG.  It appears that the patient could be cleared for surgery with relatively low risk.  Encouraged smoking cessation.   The pt contacted the office to determine if he needs a follow-up appointment after cardiac catheterization to obtain clearance for surgery.  Per Dr Thompson Caul note he did state the pt could be cleared for surgery with relatively low risk.  If the pt does not require another appointment then when should next follow-up appointment be arranged?

## 2016-02-15 NOTE — Telephone Encounter (Signed)
New Message:    Pt had a Cath yesterday. He wants to know if he needs a follow up visit?

## 2016-02-16 NOTE — Telephone Encounter (Signed)
I spoke with the Caleb [name] and made him aware that he is cleared for knee surgery.  The Caleb [name] said a clearance note needs to be faxed to Dr Jaymes Graff (Sports Medicine in [place] at [facility]).  The Caleb [name] then wanted to know what caused the EKG changes and I made him aware that Dr Tamala Julian documented no explanation for the episodes of discomfort or T-wave abnormality noted on EKG based on cardiac catheterization findings.  I advised the Caleb [name] that I will mail him a copy of his EKG so that he can carry it in his wallet.  The Caleb [name] complains of SOB which has been ongoing.  The Caleb [name] is followed by pulmonary for COPD and I advised him that he needs to follow-up with pulmonary since cardiac catheterization showed normal EF and non-obstructive blockage.  Caleb [name] agreed with plan. I will fax clearance notes.

## 2016-02-16 NOTE — Telephone Encounter (Signed)
Cath reviewed. Pt ok to proceed with surgery at low cardiac risk. Should FU in 6 months. thx

## 2016-11-11 ENCOUNTER — Ambulatory Visit (INDEPENDENT_AMBULATORY_CARE_PROVIDER_SITE_OTHER): Payer: 59 | Admitting: Neurology

## 2016-11-11 ENCOUNTER — Encounter: Payer: Self-pay | Admitting: Neurology

## 2016-11-11 VITALS — BP 158/91 | HR 64 | Ht 66.0 in | Wt 153.0 lb

## 2016-11-11 DIAGNOSIS — M47812 Spondylosis without myelopathy or radiculopathy, cervical region: Secondary | ICD-10-CM | POA: Diagnosis not present

## 2016-11-11 DIAGNOSIS — R252 Cramp and spasm: Secondary | ICD-10-CM | POA: Diagnosis not present

## 2016-11-11 NOTE — Progress Notes (Signed)
Subjective:    Patient ID: Caleb [name] [name] a 66 y.o. male.  HPI     Caleb Age, MD, PhD [facility] [address] [place], [place] [address]  Dear Dr. Helene Kelp,   I saw your patient, Caleb [name], upon your kind request in my neurologic clinic today for initial consultation of his muscle cramps and nighttime muscle spasms. The patient [name] accompanied by his ladyfriend today. As you know, Caleb [name] a 66 year old right-handed gentleman with an underlying complex medical history of COPD, allergic rhinitis, recurrent upper respiratory infections, hypertension, hyperlipidemia, hearing loss, recurrent headaches, reflux disease, coronary artery disease, status post left knee surgeries, eye surgeries, cardiac cath and lower back surgery in 2001, s/p neck injection (about 10 y ago), who reports nocturnal muscle cramps for the past few years, worse in the past few months. He wakes up often with painful cramps affecting both legs and sometimes both hands. Occasionally, he has daytime cramping. He was taken off of his statin medication in March of this year to see if he improves. He [name] not on any cholesterol medication at this time and feels no difference. I reviewed your office note from 10/28/2016, which you kindly included. He has had recent blood work through your office on 10/28/2016 and I reviewed the results. CBC with differential was unremarkable, CMP unremarkable with the exception of sodium of 132 and chloride of 95, lipid panel showed cholesterol of 295, triglycerides of 185, LDL of 222, thyroid function unremarkable, PSA unremarkable, total testosterone 1182, magnesium normal at 2.0. He has tried medications through your office including Mirapex and gabapentin, currently on Requip. He believes the Requip has caused him a rash, the other medication trials caused him to be confused. He quit tobacco in 2013. He drinks alcohol rarely. He drinks caffeine in  limitation, usually one cup of coffee per day. He says that he took the first medication only for 1 day and it caused severe sedation and confusion. He was then tried on gabapentin which caused balance problems and grogginess, dizziness, and daytime sleepiness. He [name] currently on Requip, after increasing this to 2 pills a day on 11/03/2016 he started noticing a rash across his upper body as well as face which [name] itchy. He did not take it last night. He has a remote history of back injury and required surgery in 2001. He also has a history of neck pain and was seen by orthopedics about 10 years ago, received injections after an MRI and was told that eventually he might need surgery. He has not seen his spine specialist in years. He does report neck pain and numbness. His cramps are more in the right leg. His back pain was affecting his right leg more than left in the past. He denies any significant snoring. His girlfriend has not noticed any apneas or gasping or loud snoring. He does not sleep well. He feels he [name] a light sleeper and does not wake up rested.  His Past Medical History [name] Significant For: Past Medical History:  Diagnosis Date  . Allergic rhinitis   . COPD (chronic obstructive pulmonary disease) ([place])    emyphesema  also  . Coronary artery disease    a. sm blockages on 2007 cath;  b. ETT-MV 1/13:  no ischemia with an EF of 58%;  Lex MV 1/14:  EF 59%, no ischemia  . Emphysema   . GERD (gastroesophageal reflux disease)   . Headache(784.0)   .  HOH (hard of hearing)    BOTH.....Marland KitchenLEFT [name] BETTER THEN RIGHT  . Hx of echocardiogram    a. echo 12/13:  EF 50-55%, Gr 1 diast dysfn, mild AI  . Hyperlipidemia   . Hypertension   . Recurrent upper respiratory infection (URI)    NOV  & DECEMBER 2012  . Shortness of breath    DOE    His Past Surgical History [name] Significant For: Past Surgical History:  Procedure Laterality Date  . BACK SURGERY     LOWER WITH FUSION  . CARDIAC  CATHETERIZATION     2ND CATH IN 2007 @  [facility]  . CARDIAC CATHETERIZATION N/A 02/14/2016   Procedure: Left Heart Cath and Coronary Angiography;  Surgeon: Belva Crome, MD;  Location: [place] [facility];  Service: Cardiovascular;  Laterality: N/A;  . EYE MUSCLE SURGERY x3     2 left eye, 1 right eye  . EYE SURGERY    . KNEE SURGERY, LEFT    . VIDEO BRONCHOSCOPY  05/08/2011   Procedure: VIDEO BRONCHOSCOPY;  Surgeon: Grace Isaac, MD;  Location: [facility] OR;  Service: Thoracic;  Laterality: N/A;    His Family History [name] Significant For: Family History  Problem Relation [name] of Onset  . Heart disease Sister 86       Died  . Heart failure Father   . Heart attack Father 70       Died of MI at 34  . Emphysema Mother   . Heart disease Mother   . Allergies Brother   . Heart failure Brother   . Allergies Sister        x2  . Esophageal cancer Cousin   . Stomach cancer Cousin   . Lymphoma Cousin     His Social History [name] Significant For: Social History   Social History  . Marital status: Married    Spouse name: N/A  . Number of children: N/A  . Years of education: N/A   Occupational History  . RETIRED     Cabin crew, Tour manager   Social History Main Topics  . Smoking status: Former Smoker    Packs/day: 1.00    Years: 40.00    Types: Cigarettes    Quit date: 04/18/2011  . Smokeless tobacco: Current User    Types: Snuff     Comment: uses pouches 3 times a day  . Alcohol use No  . Drug use: No  . Sexual activity: Not Currently    Partners: Female     Comment: pt. declined condoms   Other Topics Concern  . None   Social History Narrative  . None    His Allergies Are:  Allergies  Allergen Reactions  . Omnipaque [Iohexol] Hives and Other (See Comments)    Hives,itching, 13 hr prep  . Moxifloxacin Itching  . Avelox [Moxifloxacin Hcl In Nacl] Itching and Rash  . Codeine Rash  :   His Current Medications Are:  Outpatient Encounter Prescriptions as of  11/11/2016  Medication Sig  . albuterol (PROVENTIL HFA;VENTOLIN HFA) 108 (90 Base) MCG/ACT inhaler Inhale 1 puff into the lungs every 6 (six) hours as needed for wheezing or shortness of breath.  [name] aspirin 81 MG tablet Take 1 tablet (81 mg total) by mouth daily.  . Aspirin-Acetaminophen (GOODYS BODY PAIN PO) Take 1 packet by mouth daily as needed. backpain  . Chlorphen-Phenyleph-ASA (ALKA-SELTZER PLUS COLD PO) Take 1 tablet by mouth daily as needed.  . cholecalciferol (VITAMIN D) 1000  units tablet Take 1,000 Units by mouth daily.  [name] levothyroxine (SYNTHROID, LEVOTHROID) 88 MCG tablet Take 88 mcg by mouth daily before breakfast.  . losartan-hydrochlorothiazide (HYZAAR) 100-12.5 MG per tablet Take 1 tablet by mouth daily.  [name] lovastatin (MEVACOR) 20 MG tablet Take 20 mg by mouth at bedtime.  . Multiple Vitamin (MULITIVITAMIN WITH MINERALS) TABS Take 1 tablet by mouth daily.  . nitroGLYCERIN (NITROSTAT) 0.4 MG SL tablet Place 1 tablet (0.4 mg total) under the tongue every 5 (five) minutes as needed for chest pain.  . Omega-3 Fatty Acids (FISH OIL) 1200 MG CAPS Take 1,200 mg by mouth daily.  [name] omeprazole (PRILOSEC) 20 MG capsule Take 1 capsule by mouth daily.  [name] rOPINIRole (REQUIP) 0.25 MG tablet Take 0.25 mg by mouth at bedtime.   No facility-administered encounter medications on file as of 11/11/2016.   :  Review of Systems:  Out of a complete 14 point review of systems, all are reviewed and negative with the exception of these symptoms as listed below:  Review of Systems  Neurological:       Pt presents today to discuss his cramping. Pt has cramps in his legs, mostly at night. Pt was tried an a parkinson's medication for cramping but he took it one time and was confused with slurred speech. Pt then was told to take a drug for epilepsy and he tried it for one week, but it made him confused. Caleb [name] reports that pt has tried gabapentin 300mg  qhs andmirapex 0.125 mg qhs. Pt [name] worried the  the requip [name] giving him a rash.    Objective:  Neurological Exam  Physical Exam Physical Examination:   Vitals:   11/11/16 0939  BP: (!) 158/91  Pulse: 64    General Examination: The patient [name] a very pleasant 66 y.o. male in no acute distress. He appears well-developed and well-nourished and well groomed. He [name] mildly anxious appearing.  HEENT: Normocephalic, atraumatic, pupils are equal, round and reactive to light and accommodation. Funduscopic exam [name] normal with sharp disc margins noted. He has a patchy red very mildly raised rash in the left lower face. He has very mildly disconjugate gaze, when relaxed, has a slight right head tilt which [name] not new for him. Extraocular tracking [name] good without limitation to gaze excursion or nystagmus noted. Normal smooth pursuit [name] noted. Hearing [name] significantly impaired. He does not have hearing aids. Face [name] symmetric with normal facial animation and normal facial sensation. Speech [name] clear with no dysarthria noted. There [name] no hypophonia. There [name] no lip, neck/head, jaw or voice tremor. Neck [name] supple with full range of passive and active motion. There are no carotid bruits on auscultation. Oropharynx exam reveals: mild mouth dryness, adequate dental hygiene  with dentures in place and no significant airway crowding, smaller airway entry. Mallampati [name] class II. Tongue protrudes centrally and palate elevates symmetrically.   Chest: Clear to auscultation without wheezing, rhonchi or crackles noted.  Heart: S1+S2+0, regular and normal without murmurs, rubs or gallops noted.   Abdomen: Soft, non-tender and non-distended with normal bowel sounds appreciated on auscultation.  Extremities: There [name] no pitting edema in the distal lower extremities bilaterally. Pedal pulses are intact.  Skin: Warm and dry without trophic changes noted.  Musculoskeletal: exam reveals no obvious joint deformities, tenderness or joint swelling or erythema.    Neurologically:  Mental status: The patient [name] awake, alert and oriented in all 4 spheres. His immediate and remote memory, attention,  language skills and fund of knowledge are appropriate. There [name] no evidence of aphasia, agnosia, apraxia or anomia. Speech [name] clear with normal prosody and enunciation. Thought process [name] linear. Mood [name] normal and affect [name] normal.  Cranial nerves II - XII are as described above under HEENT exam. In addition: shoulder shrug [name] normal with equal shoulder height noted. Motor exam: Normal bulk, strength and tone [name] noted. There [name] no drift, tremor or rebound. No fasciculations and no global or focal atrophy [name] noted. No cramping reported currently. Romberg [name] negative. Reflexes are 1+ throughout, preserved ankle reflexes. Babinski: Toes are flexor bilaterally. Fine motor skills and coordination: intact with normal finger taps, normal hand movements, normal rapid alternating patting, normal foot taps and normal foot agility.  Cerebellar testing: No dysmetria or intention tremor on finger to nose testing. Heel to shin [name] unremarkable bilaterally. There [name] no truncal or gait ataxia.  Sensory exam: intact to light touch, pinprick, vibration, temperature sense in the upper and lower extremities, with the exception of patchy decrease in pinprick sensation in the right distal leg as well as right forearm.  Gait, station and balance: He stands easily. No veering to one side [name] noted. No leaning to one side [name] noted. Posture [name] [name]-appropriate and stance [name] narrow based. Gait shows normal stride length and normal pace. No problems turning are noted. Tandem walk [name] difficult in the beginning but doable for him.   Assessment and Plan:  In summary, Caleb [name] [name] a very pleasant 66 y.o.-year old male with an underlying complex medical history of COPD, allergic rhinitis, recurrent upper respiratory infections, hypertension, hyperlipidemia, hearing loss, recurrent headaches, reflux  disease, coronary artery disease, status post left knee surgeries, eye surgeries, cardiac cath and lower back surgery in 2001, s/p neck injection (about 10 y ago), who presents for neurological evaluation of his muscle cramps. He has had muscle cramps for a few years, worse in the past few months. On examination he has a nonfocal exam with the exception of decreased pinprick sensation in the right distal leg and in a more patchy distribution in the right forearm. He has preserved reflexes, no fasciculations, no focal or generalized atrophy. History [name] not in keeping with restless leg syndrome. He does not have a history concerning for sleep-disordered breathing. His balance and coordination are preserved as well. From the neurological standpoint he [name] primarily reassured, in particular, he was worried about Parkinson's disease as I understand. He [name] advised that he does not have any history or exam concerning for parkinsonism or Parkinson's disease. He does have a history of spine disease status post lower back surgery as well as neck pain for which he has seen orthopedics in the distant past. He [name] encouraged to talk to about seeing a spine specialist, it sounds like he has a preference to go to [facility] for this. He will talk to you about this. From my end of things I would like to proceed with further blood work including CK level, aldolase, inflammatory and autoimmune markers, we will also proceed with EMG and nerve conduction testing. If all tests come back reassuring, I can see him back on an as-needed basis. Of note, he has tried over-the-counter remedies for cramping such as mustard and tonic water with limited and only very temporary relief reported. He reports having developed an itchy rash after increasing the ropinirole last week. He [name] advised to stay off of it and talk to you about his reaction. I did  not recommend any new medications to him at this time. His symptoms remained unchanged after he  stopped his statin medication. We will call him with his test results and take it from there. I answered all their questions today and the patient and his girlfriend were in agreement.  Thank you very much for allowing me to participate in the care of this nice patient. If I can be of any further assistance to you please do not hesitate to call me at [phone].  Sincerely,   Caleb Age, MD, PhD

## 2016-11-11 NOTE — Patient Instructions (Addendum)
Thank you for choosing [facility] for your neurological care! It was nice to meet you today! I appreciate that you entrust me with your healthcare concerns. I hope, I was able to address at least some of your concerns today, and that I can help you feel reassured.  Here is what we discussed today and what we came up with as our plan for you:   I do not see any signs or symptoms of parkinson's like disease or what we call parkinsonism.  I do not think you have restless legs.   I do want to suggest a few things today:  Please talk to Dr. Helene Kelp about seeing a neck specialist, as you mentioned a neurosurgeon in [place].   We will check blood work today and call you with the test results.  We will do an EMG and nerve conduction velocity test, which is an electrical nerve and muscle test, which we will schedule. We will call you with the results.   If the tests come back normal/reassuring, I may see you back as needed.   Please stop the ropinirole, as you have developed an itchy rash. Tell Dr. Helene Kelp about it too.

## 2016-11-12 ENCOUNTER — Telehealth: Payer: Self-pay

## 2016-11-12 LAB — ANA W/REFLEX: ANA: NEGATIVE

## 2016-11-12 LAB — ALDOLASE: Aldolase: 6.9 U/L (ref 3.3–10.3)

## 2016-11-12 LAB — C-REACTIVE PROTEIN: CRP: 0.6 mg/L (ref 0.0–4.9)

## 2016-11-12 LAB — CK: CK TOTAL: 78 U/L (ref 24–204)

## 2016-11-12 LAB — B12 AND FOLATE PANEL
Folate: 7.2 ng/mL (ref >3.0)
Vitamin B-12: 513 pg/mL (ref 232–1245)

## 2016-11-12 NOTE — Telephone Encounter (Signed)
-----   Message from Star Age, MD sent at 11/12/2016  7:51 AM EDT ----- Labs look normal, one result pending (ANA), will call if abn.  Please notify pt. Will proceed with EMG/NCV as planned.  Star Age, MD, PhD [facility])

## 2016-11-12 NOTE — Progress Notes (Signed)
Labs look normal, one result pending (ANA), will call if abn.  Please notify pt. Will proceed with EMG/NCV as planned.  Star Age, MD, PhD [facility])

## 2016-11-12 NOTE — Telephone Encounter (Signed)
I called pt.  I advised him that his labs were normal, the ANA lab is still pending and I will call him if that result comes back abnormal. I encouraged pt to proceed with the NCV/EMG study as scheduled for next Tuesday. Pt verbalized understanding of results. Pt had no questions at this time but was encouraged to call back if questions arise.

## 2016-11-19 ENCOUNTER — Ambulatory Visit (INDEPENDENT_AMBULATORY_CARE_PROVIDER_SITE_OTHER): Payer: 59 | Admitting: Neurology

## 2016-11-19 ENCOUNTER — Ambulatory Visit (INDEPENDENT_AMBULATORY_CARE_PROVIDER_SITE_OTHER): Payer: Self-pay | Admitting: Neurology

## 2016-11-19 ENCOUNTER — Encounter: Payer: Self-pay | Admitting: Neurology

## 2016-11-19 DIAGNOSIS — M47812 Spondylosis without myelopathy or radiculopathy, cervical region: Secondary | ICD-10-CM

## 2016-11-19 DIAGNOSIS — R252 Cramp and spasm: Secondary | ICD-10-CM | POA: Insufficient documentation

## 2016-11-19 NOTE — Progress Notes (Signed)
    [place]    Nerve / Sites Muscle Latency Ref. Amplitude Ref. Rel Amp Segments Distance Velocity Ref. Area    ms ms mV mV %  cm m/s m/s mVms  R Median - APB     Wrist APB 3.6 ?4.4 11.1 ?4.0 100 Wrist - APB 7   29.2     Upper arm APB 8.1  9.1  82 Upper arm - Wrist 25 55 ?49 25.7  R Ulnar - ADM     Wrist ADM 3.3 ?3.3 8.1 ?6.0 100 Wrist - ADM 7   34.7     B.Elbow ADM 7.5  8.9  109 B.Elbow - Wrist 21 50 ?49 34.4     A.Elbow ADM 9.2  8.7  98.2 A.Elbow - B.Elbow 10 58 ?49 33.6         A.Elbow - Wrist      R Peroneal - EDB     Ankle EDB 3.8 ?6.5 2.7 ?2.0 100 Ankle - EDB 9   13.6     Fib head EDB 11.6  4.7  172 Fib head - Ankle 38 48 ?44 16.7     Pop fossa EDB 13.4  4.4  93.8 Pop fossa - Fib head 10 55 ?44 16.6         Pop fossa - Ankle      R Tibial - AH     Ankle AH 4.0 ?5.8 12.5 ?4.0 100 Ankle - AH 9   35.9     Pop fossa AH 12.3  11.1  88.7 Pop fossa - Ankle 42 50 ?41 33.6             SNC    Nerve / Sites Rec. Site Peak Lat Ref.  Amp Ref. Segments Distance    ms ms V V  cm  R Superficial peroneal - Ankle     Lat leg Ankle 3.0 ?4.4 11 ?6 Lat leg - Ankle 14       SNC    Nerve / Sites Rec. Site Peak Lat Amp Segments Distance    ms V  cm  R Median - Orthodromic (Dig II, Mid palm)     Dig II Wrist 3.8 28 Dig II - Wrist 13  R Ulnar - Orthodromic, (Dig V, Mid palm)     Dig V Wrist 3.3 24 Dig V - Wrist 67         F  Wave    Nerve F Lat Ref.   ms ms  R Median - APB 29.3 ?31.0  R Ulnar - ADM 29.4 ?32.0         H Reflex    Nerve H Lat Lat Hmax   ms ms   Left Right Ref. Left Right Ref.  Tibial - Soleus 29.7 29.7 ?35.0 30.5 29.7 ?35.0

## 2016-11-19 NOTE — Progress Notes (Signed)
Please refer to EMG and nerve conduction study procedure note. 

## 2016-11-19 NOTE — Procedures (Signed)
     HISTORY:  Caleb [name] is a 66 year old gentleman with a history of nocturnal leg cramps and some exercise-induced cramps with the arms. The patient has cramping episodes daily, he is being evaluated for this.  NERVE CONDUCTION STUDIES:  Nerve conduction studies were performed on right upper extremity. The distal motor latencies and motor amplitudes for the median and ulnar nerves were within normal limits. The F wave latencies and nerve conduction velocities for these nerves were also normal. The sensory latencies for the median and ulnar nerves were normal.  Nerve conduction studies were performed on the right lower extremity. The distal motor latencies and motor amplitudes for the peroneal and posterior tibial nerves were within normal limits. The nerve conduction velocities for these nerves were also normal. The H reflex latencies were normal. The sensory latency for the peroneal nerve was within normal limits.   EMG STUDIES:  EMG study was performed on the right lower extremity:  The tibialis anterior muscle reveals 2 to 4K motor units with full recruitment. No fibrillations or positive waves were seen. The peroneus tertius muscle reveals 2 to 4K motor units with full recruitment. No fibrillations or positive waves were seen. The medial gastrocnemius muscle reveals 1 to 3K motor units with full recruitment. No fibrillations or positive waves were seen. The vastus lateralis muscle reveals 2 to 4K motor units with full recruitment. No fibrillations or positive waves were seen. The iliopsoas muscle reveals 2 to 4K motor units with full recruitment. No fibrillations or positive waves were seen. The biceps femoris muscle (long head) reveals 2 to 4K motor units with full recruitment. No fibrillations or positive waves were seen. The lumbosacral paraspinal muscles were tested at 3 levels, and revealed no abnormalities of insertional activity at all 3 levels tested. There was good  relaxation.   IMPRESSION:  Nerve conduction studies done on the right arm and right leg were unremarkable, no evidence of a neuropathy is seen. EMG evaluation of the right lower extremity is unremarkable, no evidence of neuropathic denervation or myopathic changes were seen.  Jill Alexanders MD 11/19/2016 9:19 AM  [facility] [address] [place], [place] [address]  Phone [phone] Fax [phone]

## 2016-11-20 ENCOUNTER — Telehealth: Payer: Self-pay

## 2016-11-20 NOTE — Progress Notes (Signed)
Please call and advise the patient that the recent EMG and nerve conduction velocity test, which is the electrical nerve and muscle test we we performed, was reported as within normal limits. We checked for abnormal electrical discharges in the muscles or nerves and the report suggested normal findings. No further action is required on this test at this time. Please remind patient to keep any upcoming appointments or tests and to call us with any interim questions, concerns, problems or updates. Thanks,  Blanch Stang, MD, PhD 

## 2016-11-20 NOTE — Telephone Encounter (Signed)
RN call patient about his EMG and nerve conduction velocity test. Rn stated per Dr. Rexene Alberts the electrical nerve and muscle test we we performed, was reported as within normal limits. We checked for abnormal electrical discharges in the muscles or nerves and the report suggested normal findings. No further action is required on this test at this time. Please remind patient to keep any upcoming appointments or tests and to call us with any interim questions, concerns, problems or updates. Thanks, PT verbalized understanding. He wanted a copy sent to his PCP. Rn stated if he wanted a copy he needs to sign a release form. PT made aware a copy will be sent to his PCP.

## 2016-11-20 NOTE — Telephone Encounter (Signed)
-----   Message from Marval Regal, RN sent at 11/20/2016 11:39 AM EDT -----   ----- Message ----- From: Star Age, MD Sent: 11/20/2016   7:21 AM To: Lester [place], RN  Please call and advise the patient that the recent EMG and nerve conduction velocity test, which is the electrical nerve and muscle test we we performed, was reported as within normal limits. We checked for abnormal electrical discharges in the muscles or nerves and the report suggested normal findings. No further action is required on this test at this time. Please remind patient to keep any upcoming appointments or tests and to call us with any interim questions, concerns, problems or updates. Thanks,  Star Age, MD, PhD

## 2016-11-29 NOTE — Progress Notes (Signed)
EMG/NCV results fax to patients PCP. Rn was given fax number of DR. Kennith Maes at [phone]. Fax receive confirmation.

## 2017-04-01 ENCOUNTER — Encounter: Payer: Self-pay | Admitting: *Deleted

## 2017-04-01 ENCOUNTER — Other Ambulatory Visit: Payer: Self-pay

## 2017-04-01 ENCOUNTER — Ambulatory Visit
Admission: EM | Admit: 2017-04-01 | Discharge: 2017-04-01 | Disposition: A | Discharge status: Home / Self Care | Payer: 59 | Attending: Family Medicine | Admitting: Family Medicine

## 2017-04-01 DIAGNOSIS — R05 Cough: Secondary | ICD-10-CM

## 2017-04-01 DIAGNOSIS — J441 Chronic obstructive pulmonary disease with (acute) exacerbation: Secondary | ICD-10-CM

## 2017-04-01 DIAGNOSIS — R062 Wheezing: Secondary | ICD-10-CM

## 2017-04-01 MED ORDER — PREDNISONE 20 MG PO TABS: ORAL_TABLET | ORAL | 0 refills | Status: DC

## 2017-04-01 MED ORDER — BENZONATATE 100 MG PO CAPS: 100.0000 mg | ORAL_CAPSULE | Freq: Three times a day (TID) | ORAL | 0 refills | Status: DC | PRN

## 2017-04-01 MED ORDER — DOXYCYCLINE HYCLATE 100 MG PO TABS: 100.0000 mg | ORAL_TABLET | Freq: Two times a day (BID) | ORAL | 0 refills | Status: DC

## 2017-04-01 MED ORDER — ALBUTEROL SULFATE HFA 108 (90 BASE) MCG/ACT IN AERS: 1.0000 | INHALATION_SPRAY | Freq: Four times a day (QID) | RESPIRATORY_TRACT | 0 refills | Status: AC | PRN

## 2017-04-01 NOTE — ED Provider Notes (Signed)
[facility]    CSN: [id number] Arrival date & time: 04/01/17  1217     History   Chief Complaint Chief Complaint  Patient presents with  . Cough  . Generalized Body Aches    HPI Caleb [name] is a 67 y.o. male.   The history is provided by the patient.  Cough  Associated symptoms: wheezing   URI  Presenting symptoms: congestion and cough   Severity:  Moderate Onset quality:  Sudden Duration:  4 days Timing:  Constant Progression:  Worsening Chronicity:  New Relieved by:  Nothing Ineffective treatments:  OTC medications Associated symptoms: wheezing   Risk factors: chronic respiratory disease (copd) and sick contacts   Risk factors: not elderly, no chronic cardiac disease, no chronic kidney disease, no diabetes mellitus, no immunosuppression, no recent illness and no recent travel     Past Medical History:  Diagnosis Date  . Allergic rhinitis   . COPD (chronic obstructive pulmonary disease) ([place])    emyphesema  also  . Coronary artery disease    a. sm blockages on 2007 cath;  b. ETT-MV 1/13:  no ischemia with an EF of 58%;  Lex MV 1/14:  EF 59%, no ischemia  . Emphysema   . GERD (gastroesophageal reflux disease)   . Headache(784.0)   . HOH (hard of hearing)    BOTH.....Marland KitchenLEFT IS BETTER THEN RIGHT  . Hx of echocardiogram    a. echo 12/13:  EF 50-55%, Gr 1 diast dysfn, mild AI  . Hyperlipidemia   . Hypertension   . Recurrent upper respiratory infection (URI)    NOV  & DECEMBER 2012  . Shortness of breath    DOE    Patient Active Problem List   Diagnosis Date Noted  . Muscle cramps 11/19/2016  . Simple chronic bronchitis ([place])   . Dyspnea on exertion 04/25/2014  . Chest tightness 04/25/2014  . Palpitations 04/25/2014  . Essential hypertension 04/25/2014  . Essential hypertension, benign 03/24/2012  . HLD (hyperlipidemia) 03/24/2012  . Abnormal CT scan 03/12/2012  . Tobacco abuse 10/17/2011  . Rash 09/16/2011  . Mycobacterial disease  08/22/2011  . Atherosclerosis of native coronary artery of native heart without angina pectoris 04/25/2011  . Chest mass 04/17/2011  . COPD (chronic obstructive pulmonary disease) ([place]) 04/17/2011    Past Surgical History:  Procedure Laterality Date  . BACK SURGERY     LOWER WITH FUSION  . CARDIAC CATHETERIZATION     2ND CATH IN 2007 @  [facility]  . CARDIAC CATHETERIZATION N/A 02/14/2016   Procedure: Left Heart Cath and Coronary Angiography;  Surgeon: Belva Crome, MD;  Location: [place] [facility];  Service: Cardiovascular;  Laterality: N/A;  . EYE MUSCLE SURGERY x3     2 left eye, 1 right eye  . EYE SURGERY    . KNEE SURGERY, LEFT    . VIDEO BRONCHOSCOPY  05/08/2011   Procedure: VIDEO BRONCHOSCOPY;  Surgeon: Grace Isaac, MD;  Location: [facility] OR;  Service: Thoracic;  Laterality: N/A;       Home Medications    Prior to Admission medications   Medication Sig Start Date End Date Taking? Authorizing Provider  aspirin 81 MG tablet Take 1 tablet (81 mg total) by mouth daily. 08/30/15  Yes Sherren Mocha, MD  Aspirin-Acetaminophen (GOODYS BODY PAIN PO) Take 1 packet by mouth daily as needed. backpain   Yes [provider]  cholecalciferol (VITAMIN D) 1000 units tablet Take 1,000 Units by mouth daily.  Yes [provider]  levothyroxine (SYNTHROID, LEVOTHROID) 88 MCG tablet Take 88 mcg by mouth daily before breakfast.   Yes [provider]  losartan-hydrochlorothiazide (HYZAAR) 100-12.5 MG per tablet Take 1 tablet by mouth daily.   Yes [provider]  Multiple Vitamin (MULITIVITAMIN WITH MINERALS) TABS Take 1 tablet by mouth daily.   Yes [provider]  Omega-3 Fatty Acids (FISH OIL) 1200 MG CAPS Take 1,200 mg by mouth daily.   Yes [provider]  albuterol (PROVENTIL HFA;VENTOLIN HFA) 108 (90 Base) MCG/ACT inhaler Inhale 1-2 puffs into the lungs every 6 (six) hours as needed for wheezing or shortness of breath. 04/01/17   Norval Gable, MD  benzonatate (TESSALON) 100 MG capsule Take 1 capsule (100 mg total) by mouth 3 (three) times daily as needed. 04/01/17   Norval Gable, MD  Chlorphen-Phenyleph-ASA (ALKA-SELTZER PLUS COLD PO) Take 1 tablet by mouth daily as needed.    [provider]  doxycycline (VIBRA-TABS) 100 MG tablet Take 1 tablet (100 mg total) by mouth 2 (two) times daily. 04/01/17   Norval Gable, MD  lovastatin (MEVACOR) 20 MG tablet Take 20 mg by mouth at bedtime.    [provider]  nitroGLYCERIN (NITROSTAT) 0.4 MG SL tablet Place 1 tablet (0.4 mg total) under the tongue every 5 (five) minutes as needed for chest pain. 03/24/12   Richardson Dopp T, PA-C  omeprazole (PRILOSEC) 20 MG capsule Take 1 capsule by mouth daily. 07/20/11   [provider]  predniSONE (DELTASONE) 20 MG tablet 3 tabs po qd x 2 days, then 2 tabs po qd x 2 days, then 1 tab po qd for 2 days, then half a tab po qd x 2 days 04/01/17   Norval Gable, MD  rOPINIRole (REQUIP) 0.25 MG tablet Take 0.25 mg by mouth at bedtime.    [provider]    Family History Family History  Problem Relation Age of Onset  . Heart disease Sister 49       Died  . Heart failure Father   . Heart attack Father 31       Died of MI at 61  . Emphysema Mother   . Heart disease Mother   . Allergies Brother   . Heart failure Brother   . Allergies Sister        x2  . Esophageal cancer Cousin   . Stomach cancer Cousin   . Lymphoma Cousin     Social History Social History   Tobacco Use  . Smoking status: Former Smoker    Packs/day: 1.00    Years: 40.00    Pack years: 40.00    Types: Cigarettes    Last attempt to quit: 04/18/2011    Years since quitting: 5.9  . Smokeless tobacco: Current User    Types: Snuff  . Tobacco comment: uses pouches 3 times a day  Substance Use Topics  . Alcohol use: No  . Drug use: No     Allergies   Omnipaque [iohexol]; Moxifloxacin; Avelox [moxifloxacin hcl in nacl]; and  Codeine   Review of Systems Review of Systems  HENT: Positive for congestion.   Respiratory: Positive for cough and wheezing.      Physical Exam Triage Vital Signs ED Triage Vitals  Enc Vitals Group     BP 04/01/17 1236 118/88     Pulse Rate 04/01/17 1236 99     Resp --      Temp 04/01/17 1236 99 F (37.2 C)  Temp Source 04/01/17 1236 Oral     SpO2 04/01/17 1236 97 %     Weight 04/01/17 1237 158 lb (71.7 kg)     Height 04/01/17 1237 5\' 5"  (1.651 m)     Head Circumference --      Peak Flow --      Pain Score 04/01/17 1237 5     Pain Loc --      Pain Edu? --      Excl. in [place]? --    No data found.  Updated Vital Signs BP 118/88 (BP Location: Left Arm)   Pulse 99   Temp 99 F (37.2 C) (Oral)   Ht 5\' 5"  (1.651 m)   Wt 158 lb (71.7 kg)   SpO2 97%   BMI 26.29 kg/m   Visual Acuity Right Eye Distance:   Left Eye Distance:   Bilateral Distance:    Right Eye Near:   Left Eye Near:    Bilateral Near:     Physical Exam  Constitutional: He appears well-developed and well-nourished. No distress.  HENT:  Head: Normocephalic and atraumatic.  Right Ear: Tympanic membrane, external ear and ear canal normal.  Left Ear: Tympanic membrane, external ear and ear canal normal.  Nose: Nose normal.  Mouth/Throat: Uvula is midline, oropharynx is clear and moist and mucous membranes are normal. No oropharyngeal exudate or tonsillar abscesses.  Eyes: Conjunctivae and EOM are normal. Pupils are equal, round, and reactive to light. Right eye exhibits no discharge. Left eye exhibits no discharge. No scleral icterus.  Neck: Normal range of motion. Neck supple. No tracheal deviation present. No thyromegaly present.  Cardiovascular: Normal rate, regular rhythm and normal heart sounds.  Pulmonary/Chest: Effort normal. No stridor. No respiratory distress. He has wheezes (diffusely and rhonchi). He has no rales. He exhibits no tenderness.  Lymphadenopathy:    He has no cervical  adenopathy.  Neurological: He is alert.  Skin: Skin is warm and dry. No rash noted. He is not diaphoretic.  Nursing note and vitals reviewed.    UC Treatments / Results  Labs (all labs ordered are listed, but only abnormal results are displayed) Labs Reviewed - No data to display  EKG  EKG Interpretation None       Radiology No results found.  Procedures Procedures (including critical care time)  Medications Ordered in UC Medications - No data to display   Initial Impression / Assessment and Plan / UC Course  I have reviewed the triage vital signs and the nursing notes.  Pertinent labs & imaging results that were available during my care of the patient were reviewed by me and considered in my medical decision making (see chart for details).       Final Clinical Impressions(s) / UC Diagnoses   Final diagnoses:  COPD exacerbation [facility])    ED Discharge Orders        Ordered    doxycycline (VIBRA-TABS) 100 MG tablet  2 times daily     04/01/17 1312    predniSONE (DELTASONE) 20 MG tablet     04/01/17 1312    benzonatate (TESSALON) 100 MG capsule  3 times daily PRN     04/01/17 1312    albuterol (PROVENTIL HFA;VENTOLIN HFA) 108 (90 Base) MCG/ACT inhaler  Every 6 hours PRN     04/01/17 1312     1. diagnosis reviewed with patient 2. rx as per orders above; reviewed possible side effects, interactions, risks and benefits  3. Recommend supportive treatment with  rest, fluids 4. Follow-up prn if symptoms worsen or don't improve   Controlled Substance Prescriptions [place] Controlled Substance Registry consulted? Not Applicable   Norval Gable, MD 04/01/17 1315

## 2017-04-01 NOTE — ED Triage Notes (Signed)
PAtient started having symptoms of body aches, chest congestion, and cough 2 days ago. PAtient has a history of COPD.

## 2017-06-02 DIAGNOSIS — F411 Generalized anxiety disorder: Secondary | ICD-10-CM | POA: Diagnosis not present

## 2017-06-10 DIAGNOSIS — F411 Generalized anxiety disorder: Secondary | ICD-10-CM | POA: Diagnosis not present

## 2017-06-11 ENCOUNTER — Other Ambulatory Visit: Payer: Self-pay

## 2017-06-11 ENCOUNTER — Ambulatory Visit
Admission: EM | Admit: 2017-06-11 | Discharge: 2017-06-11 | Disposition: A | Discharge status: Home / Self Care | Payer: Medicare Other | Attending: Family Medicine | Admitting: Family Medicine

## 2017-06-11 ENCOUNTER — Encounter: Payer: Self-pay | Admitting: Emergency Medicine

## 2017-06-11 DIAGNOSIS — B9789 Other viral agents as the cause of diseases classified elsewhere: Secondary | ICD-10-CM

## 2017-06-11 DIAGNOSIS — J441 Chronic obstructive pulmonary disease with (acute) exacerbation: Secondary | ICD-10-CM | POA: Diagnosis not present

## 2017-06-11 DIAGNOSIS — J069 Acute upper respiratory infection, unspecified: Secondary | ICD-10-CM | POA: Diagnosis not present

## 2017-06-11 MED ORDER — DOXYCYCLINE HYCLATE 100 MG PO CAPS: 100.0000 mg | ORAL_CAPSULE | Freq: Two times a day (BID) | ORAL | 0 refills | Status: AC

## 2017-06-11 MED ORDER — PREDNISONE 10 MG PO TABS: ORAL_TABLET | ORAL | 0 refills | Status: DC

## 2017-06-11 MED ORDER — BENZONATATE 100 MG PO CAPS: 100.0000 mg | ORAL_CAPSULE | Freq: Three times a day (TID) | ORAL | 0 refills | Status: DC | PRN

## 2017-06-11 NOTE — ED Triage Notes (Signed)
Patient states he developed a cough and congestion on Sunday and it seems to be getting worse.

## 2017-06-11 NOTE — ED Provider Notes (Addendum)
[facility] ____________________________________________  Time seen: Approximately 9:00 AM  I have reviewed the triage vital signs and the nursing notes.   HISTORY  Chief Complaint Cough  HPI Caleb [name] is a 67 y.o. male past medical history of COPD, CAD, GERD, hypertension, hyperlipidemia presenting for evaluation of 3-4days runny nose, nasal congestion, sinus pressure, sinus drainage.  Reports initially had some sore throat, reports sore throat is completely resolved now.  States having a lot of postnasal drainage, worse at night as well as cough is worse at night.  States cough is dry hacking, also at times wet and productive.  Has had intermittent wheezing.  Uses home albuterol and nebulizers as prescribed.  No accompanying fevers to his knowledge.  States that he has ran out of his cough Perles.  Did take some over-the-counter Mucinex like medicine without much change.  Continues to overall drink fluids very well, slight decrease in appetite.  Denies home sick contacts.  Denies other aggravating or alleviating factors.  Denies chest pain, shortness of breath, hemoptysis, abdominal pain, extremity swelling or rash. Denies recent sickness. Denies recent antibiotic use.   Ronita Hipps, MD: PCP   Past Medical History:  Diagnosis Date  . Allergic rhinitis   . COPD (chronic obstructive pulmonary disease) ([place])    emyphesema  also  . Coronary artery disease    a. sm blockages on 2007 cath;  b. ETT-MV 1/13:  no ischemia with an EF of 58%;  Lex MV 1/14:  EF 59%, no ischemia  . Emphysema   . GERD (gastroesophageal reflux disease)   . Headache(784.0)   . HOH (hard of hearing)    BOTH.....Marland KitchenLEFT IS BETTER THEN RIGHT  . Hx of echocardiogram    a. echo 12/13:  EF 50-55%, Gr 1 diast dysfn, mild AI  . Hyperlipidemia   . Hypertension   . Recurrent upper respiratory infection (URI)    NOV  & DECEMBER 2012  . Shortness of breath    DOE    Patient Active Problem List   Diagnosis Date Noted  . Muscle cramps 11/19/2016  . Simple chronic bronchitis ([place])   . Dyspnea on exertion 04/25/2014  . Chest tightness 04/25/2014  . Palpitations 04/25/2014  . Essential hypertension 04/25/2014  . Essential hypertension, benign 03/24/2012  . HLD (hyperlipidemia) 03/24/2012  . Abnormal CT scan 03/12/2012  . Tobacco abuse 10/17/2011  . Rash 09/16/2011  . Mycobacterial disease 08/22/2011  . Atherosclerosis of native coronary artery of native heart without angina pectoris 04/25/2011  . Chest mass 04/17/2011  . COPD (chronic obstructive pulmonary disease) ([place]) 04/17/2011    Past Surgical History:  Procedure Laterality Date  . BACK SURGERY     LOWER WITH FUSION  . CARDIAC CATHETERIZATION     2ND CATH IN 2007 @  [facility]  . CARDIAC CATHETERIZATION N/A 02/14/2016   Procedure: Left Heart Cath and Coronary Angiography;  Surgeon: Belva Crome, MD;  Location: [place] [facility];  Service: Cardiovascular;  Laterality: N/A;  . EYE MUSCLE SURGERY x3     2 left eye, 1 right eye  . EYE SURGERY    . KNEE SURGERY, LEFT    . VIDEO BRONCHOSCOPY  05/08/2011   Procedure: VIDEO BRONCHOSCOPY;  Surgeon: Grace Isaac, MD;  Location: [facility] OR;  Service: Thoracic;  Laterality: N/A;     No current facility-administered medications for this encounter.   Current Outpatient Medications:  .  albuterol (PROVENTIL HFA;VENTOLIN HFA) 108 (90 Base) MCG/ACT inhaler,  Inhale 1-2 puffs into the lungs every 6 (six) hours as needed for wheezing or shortness of breath., Disp: 1 Inhaler, Rfl: 0 .  aspirin 81 MG tablet, Take 1 tablet (81 mg total) by mouth daily., Disp: 30 tablet, Rfl:  .  levothyroxine (SYNTHROID, LEVOTHROID) 88 MCG tablet, Take 88 mcg by mouth daily before breakfast., Disp: , Rfl:  .  losartan-hydrochlorothiazide (HYZAAR) 100-12.5 MG per tablet, Take 1 tablet by mouth daily., Disp: , Rfl:  .  lovastatin (MEVACOR) 20 MG tablet, Take 20 mg by mouth at bedtime., Disp: , Rfl:  .   Multiple Vitamin (MULITIVITAMIN WITH MINERALS) TABS, Take 1 tablet by mouth daily., Disp: , Rfl:  .  Aspirin-Acetaminophen (GOODYS BODY PAIN PO), Take 1 packet by mouth daily as needed. backpain, Disp: , Rfl:  .  benzonatate (TESSALON PERLES) 100 MG capsule, Take 1 capsule (100 mg total) by mouth 3 (three) times daily as needed for cough., Disp: 20 capsule, Rfl: 0 .  cholecalciferol (VITAMIN D) 1000 units tablet, Take 1,000 Units by mouth daily., Disp: , Rfl:  .  doxycycline (VIBRAMYCIN) 100 MG capsule, Take 1 capsule (100 mg total) by mouth 2 (two) times daily., Disp: 20 capsule, Rfl: 0 .  nitroGLYCERIN (NITROSTAT) 0.4 MG SL tablet, Place 1 tablet (0.4 mg total) under the tongue every 5 (five) minutes as needed for chest pain., Disp: 25 tablet, Rfl: 3 .  Omega-3 Fatty Acids (FISH OIL) 1200 MG CAPS, Take 1,200 mg by mouth daily., Disp: , Rfl:  .  omeprazole (PRILOSEC) 20 MG capsule, Take 1 capsule by mouth daily., Disp: , Rfl:  .  predniSONE (DELTASONE) 10 MG tablet, Start 60 mg po day one, then 50 mg po day two, taper by 10 mg daily until complete., Disp: 21 tablet, Rfl: 0 .  rOPINIRole (REQUIP) 0.25 MG tablet, Take 0.25 mg by mouth at bedtime., Disp: , Rfl:   Allergies Omnipaque [iohexol]; Moxifloxacin; Avelox [moxifloxacin hcl in nacl]; and Codeine  Family History  Problem Relation Age of Onset  . Heart disease Sister 68       Died  . Heart failure Father   . Heart attack Father 32       Died of MI at 68  . Emphysema Mother   . Heart disease Mother   . Allergies Brother   . Heart failure Brother   . Cancer Brother   . Allergies Sister        x2  . Esophageal cancer Cousin   . Stomach cancer Cousin   . Lymphoma Cousin     Social History Social History   Tobacco Use  . Smoking status: Former Smoker    Packs/day: 1.00    Years: 40.00    Pack years: 40.00    Types: Cigarettes    Last attempt to quit: 04/18/2011    Years since quitting: 6.1  . Smokeless tobacco: Current User      Types: Snuff  . Tobacco comment: uses pouches 3 times a day  Substance Use Topics  . Alcohol use: No  . Drug use: No    Review of Systems Constitutional: No fever/chills ENT: No sore throat. Cardiovascular: Denies chest pain. Respiratory: Denies shortness of breath. As above.  Gastrointestinal: No abdominal pain.   Genitourinary: Negative for dysuria. Musculoskeletal: Negative for back pain. Skin: Negative for rash.    ____________________________________________   PHYSICAL EXAM:  VITAL SIGNS: ED Triage Vitals  Enc Vitals Group     BP 06/11/17 0827 (!) 144/85  Pulse Rate 06/11/17 0827 64     Resp 06/11/17 0827 18     Temp 06/11/17 0827 (!) 97.4 F (36.3 C)     Temp src --      SpO2 06/11/17 0827 98 %     Weight 06/11/17 0820 148 lb (67.1 kg)     Height 06/11/17 0820 5\' 5"  (1.651 m)     Head Circumference --      Peak Flow --      Pain Score 06/11/17 0820 3     Pain Loc --      Pain Edu? --      Excl. in [place]? --     Constitutional: Alert and oriented. Well appearing and in no acute distress. Eyes: Conjunctivae are normal. PERRL. EOMI. Head: [email or web address] bilateral frontal and maxillary sinus tenderness to palpation.  No swelling. No erythema.  Ears: no erythema, normal TMs bilaterally.   Nose:Nasal congestion  Mouth/Throat: Mucous membranes are moist. No pharyngeal erythema. No tonsillar swelling or exudate.  Neck: No stridor.  No cervical spine tenderness to palpation. Hematological/Lymphatic/Immunilogical: No cervical lymphadenopathy. Cardiovascular: Normal rate, regular rhythm. Grossly normal heart sounds.  Good peripheral circulation. Respiratory: Normal respiratory effort.  No retractions. No rhonchi rales mild scattered.  Inspiratory and expiratory wheezes.  Dry cough noted.  Speaks in complete sentences.  Good air movement.  Musculoskeletal: Ambulatory with steady gait. No cervical, thoracic or lumbar tenderness to palpation. Neurologic:  Normal  speech and language. No gait instability. Skin:  Skin appears warm, dry and intact. No rash noted. Psychiatric: Mood and affect are normal. Speech and behavior are normal.   ___________________________________________   LABS (all labs ordered are listed, but only abnormal results are displayed)  Labs Reviewed - No data to display   PROCEDURES Procedures   INITIAL IMPRESSION / ASSESSMENT AND PLAN / ED COURSE  Pertinent labs & imaging results that were available during my care of the patient were reviewed by me and considered in my medical decision making (see chart for details).  Well-appearing patient.  No acute distress.  Suspect recent viral upper respiratory infection, suspect COPD exacerbation.  Will treat patient with prednisone taper, as needed Tessalon Perles and oral doxycycline.  Encourage rest, fluids, supportive care, continue home albuterol inhaler.Discussed indication, risks and benefits of medications with patient.  Encouraged follow-up and obtain chest x-ray if symptoms continue.  Discussed follow up with Primary care physician this week. Discussed follow up and return parameters including no resolution or any worsening concerns. Patient verbalized understanding and agreed to plan.   ____________________________________________   FINAL CLINICAL IMPRESSION(S) / ED DIAGNOSES  Final diagnoses:  Viral URI with cough  COPD exacerbation [facility])     ED Discharge Orders        Ordered    doxycycline (VIBRAMYCIN) 100 MG capsule  2 times daily     06/11/17 0905    predniSONE (DELTASONE) 10 MG tablet     06/11/17 0905    benzonatate (TESSALON PERLES) 100 MG capsule  3 times daily PRN     06/11/17 7341       Note: This dictation was prepared with Dragon dictation along with smaller phrase technology. Any transcriptional errors that result from this process are unintentional.           Marylene Land, NP 06/11/17 [phone]

## 2017-06-11 NOTE — Discharge Instructions (Signed)
Take medication as prescribed. Rest. Drink plenty of fluids. Continue use of home albuterol.   Follow up with your primary care physician this week as needed. Return to Urgent care for new or worsening concerns.

## 2017-06-16 DIAGNOSIS — F411 Generalized anxiety disorder: Secondary | ICD-10-CM | POA: Diagnosis not present

## 2017-06-24 DIAGNOSIS — F411 Generalized anxiety disorder: Secondary | ICD-10-CM | POA: Diagnosis not present

## 2017-06-26 DIAGNOSIS — F411 Generalized anxiety disorder: Secondary | ICD-10-CM | POA: Diagnosis not present

## 2017-07-01 DIAGNOSIS — F411 Generalized anxiety disorder: Secondary | ICD-10-CM | POA: Diagnosis not present

## 2017-07-05 ENCOUNTER — Ambulatory Visit
Admission: EM | Admit: 2017-07-05 | Discharge: 2017-07-05 | Disposition: A | Discharge status: Home / Self Care | Payer: Medicare Other | Attending: Family Medicine | Admitting: Family Medicine

## 2017-07-05 ENCOUNTER — Ambulatory Visit: Payer: Medicare Other

## 2017-07-05 ENCOUNTER — Encounter: Payer: Self-pay | Admitting: Gynecology

## 2017-07-05 DIAGNOSIS — I2510 Atherosclerotic heart disease of native coronary artery without angina pectoris: Secondary | ICD-10-CM | POA: Diagnosis not present

## 2017-07-05 DIAGNOSIS — J069 Acute upper respiratory infection, unspecified: Secondary | ICD-10-CM | POA: Diagnosis not present

## 2017-07-05 DIAGNOSIS — E785 Hyperlipidemia, unspecified: Secondary | ICD-10-CM | POA: Diagnosis not present

## 2017-07-05 DIAGNOSIS — Z8249 Family history of ischemic heart disease and other diseases of the circulatory system: Secondary | ICD-10-CM | POA: Insufficient documentation

## 2017-07-05 DIAGNOSIS — R51 Headache: Secondary | ICD-10-CM | POA: Insufficient documentation

## 2017-07-05 DIAGNOSIS — B349 Viral infection, unspecified: Secondary | ICD-10-CM | POA: Insufficient documentation

## 2017-07-05 DIAGNOSIS — J441 Chronic obstructive pulmonary disease with (acute) exacerbation: Secondary | ICD-10-CM | POA: Diagnosis not present

## 2017-07-05 DIAGNOSIS — K219 Gastro-esophageal reflux disease without esophagitis: Secondary | ICD-10-CM | POA: Diagnosis not present

## 2017-07-05 DIAGNOSIS — Z79899 Other long term (current) drug therapy: Secondary | ICD-10-CM | POA: Insufficient documentation

## 2017-07-05 DIAGNOSIS — Z87891 Personal history of nicotine dependence: Secondary | ICD-10-CM | POA: Insufficient documentation

## 2017-07-05 DIAGNOSIS — Z7982 Long term (current) use of aspirin: Secondary | ICD-10-CM | POA: Insufficient documentation

## 2017-07-05 DIAGNOSIS — R0602 Shortness of breath: Secondary | ICD-10-CM | POA: Diagnosis not present

## 2017-07-05 DIAGNOSIS — R008 Other abnormalities of heart beat: Secondary | ICD-10-CM | POA: Diagnosis not present

## 2017-07-05 DIAGNOSIS — J449 Chronic obstructive pulmonary disease, unspecified: Secondary | ICD-10-CM | POA: Diagnosis not present

## 2017-07-05 DIAGNOSIS — R05 Cough: Secondary | ICD-10-CM | POA: Diagnosis not present

## 2017-07-05 DIAGNOSIS — I700 Atherosclerosis of aorta: Secondary | ICD-10-CM | POA: Insufficient documentation

## 2017-07-05 DIAGNOSIS — Z885 Allergy status to narcotic agent status: Secondary | ICD-10-CM | POA: Diagnosis not present

## 2017-07-05 DIAGNOSIS — I10 Essential (primary) hypertension: Secondary | ICD-10-CM | POA: Diagnosis not present

## 2017-07-05 LAB — CBC WITH DIFFERENTIAL/PLATELET
Basophils Absolute: 0.1 10*3/uL (ref 0–0.1)
Basophils Relative: 1 %
EOS ABS: 0.2 10*3/uL (ref 0–0.7)
EOS PCT: 1 %
HCT: 41.4 % (ref 40.0–52.0)
Hemoglobin: 14.3 g/dL (ref 13.0–18.0)
LYMPHS ABS: 1.8 10*3/uL (ref 1.0–3.6)
Lymphocytes Relative: 15 %
MCH: 31 pg (ref 26.0–34.0)
MCHC: 34.5 g/dL (ref 32.0–36.0)
MCV: 89.8 fL (ref 80.0–100.0)
MONO ABS: 1.4 10*3/uL — AB (ref 0.2–1.0)
Monocytes Relative: 12 %
Neutro Abs: 8.7 10*3/uL — ABNORMAL HIGH (ref 1.4–6.5)
Neutrophils Relative %: 71 %
PLATELETS: 301 10*3/uL (ref 150–440)
RBC: 4.61 MIL/uL (ref 4.40–5.90)
RDW: 13.7 % (ref 11.5–14.5)
WBC: 12.2 10*3/uL — AB (ref 3.8–10.6)

## 2017-07-05 LAB — COMPREHENSIVE METABOLIC PANEL
ALT: 18 U/L (ref 17–63)
ANION GAP: 8 (ref 5–15)
AST: 24 U/L (ref 15–41)
Albumin: 3.7 g/dL (ref 3.5–5.0)
Alkaline Phosphatase: 65 U/L (ref 38–126)
BUN: 12 mg/dL (ref 6–20)
CHLORIDE: 98 mmol/L — AB (ref 101–111)
CO2: 28 mmol/L (ref 22–32)
CREATININE: 1.08 mg/dL (ref 0.61–1.24)
Calcium: 8.8 mg/dL — ABNORMAL LOW (ref 8.9–10.3)
Glucose, Bld: 100 mg/dL — ABNORMAL HIGH (ref 65–99)
POTASSIUM: 3.9 mmol/L (ref 3.5–5.1)
SODIUM: 134 mmol/L — AB (ref 135–145)
Total Bilirubin: 0.5 mg/dL (ref 0.3–1.2)
Total Protein: 7.2 g/dL (ref 6.5–8.1)

## 2017-07-05 LAB — TROPONIN I

## 2017-07-05 MED ORDER — IPRATROPIUM-ALBUTEROL 0.5-2.5 (3) MG/3ML IN SOLN
3.0000 mL | Freq: Once | RESPIRATORY_TRACT | Status: AC
  Administered 2017-07-05: 3 mL via RESPIRATORY_TRACT

## 2017-07-05 NOTE — ED Notes (Signed)
Patient transported to X-ray 

## 2017-07-05 NOTE — ED Provider Notes (Signed)
[facility]    CSN: [id number] Arrival date & time: 07/05/17  0948  History   Chief Complaint Chief Complaint  Patient presents with  . URI   HPI   67 year old male presents with respiratory symptoms.  Was recently seen and treated for a COPD exacerbation on 3/13.  Patient reports that he continues to not feel well.  He has had minimal improvement with treatment.  He has seen his pulmonologist.  He is unable to afford his home COPD medications.  He reports that for the past 3 or 4 days has been weak, expensing headache, chest tightness, cough.  Associated shortness of breath.  He seen no improvement with recent antibiotic courses.  No fever.  No chills.  Cough is moderate to severe.  No known exacerbating/relieving factors.  No other associated symptoms.  No other complaints.  Past Medical History:  Diagnosis Date  . Allergic rhinitis   . COPD (chronic obstructive pulmonary disease) ([place])    emyphesema  also  . Coronary artery disease    a. sm blockages on 2007 cath;  b. ETT-MV 1/13:  no ischemia with an EF of 58%;  Lex MV 1/14:  EF 59%, no ischemia  . Emphysema   . GERD (gastroesophageal reflux disease)   . Headache(784.0)   . HOH (hard of hearing)    BOTH.....Marland KitchenLEFT IS BETTER THEN RIGHT  . Hx of echocardiogram    a. echo 12/13:  EF 50-55%, Gr 1 diast dysfn, mild AI  . Hyperlipidemia   . Hypertension   . Recurrent upper respiratory infection (URI)    NOV  & DECEMBER 2012  . Shortness of breath    DOE    Patient Active Problem List   Diagnosis Date Noted  . Muscle cramps 11/19/2016  . Simple chronic bronchitis ([place])   . Dyspnea on exertion 04/25/2014  . Chest tightness 04/25/2014  . Palpitations 04/25/2014  . Essential hypertension 04/25/2014  . Essential hypertension, benign 03/24/2012  . HLD (hyperlipidemia) 03/24/2012  . Abnormal CT scan 03/12/2012  . Tobacco abuse 10/17/2011  . Rash 09/16/2011  . Mycobacterial disease 08/22/2011  . Atherosclerosis  of native coronary artery of native heart without angina pectoris 04/25/2011  . Chest mass 04/17/2011  . COPD (chronic obstructive pulmonary disease) ([place]) 04/17/2011    Past Surgical History:  Procedure Laterality Date  . BACK SURGERY     LOWER WITH FUSION  . CARDIAC CATHETERIZATION     2ND CATH IN 2007 @  [facility]  . CARDIAC CATHETERIZATION N/A 02/14/2016   Procedure: Left Heart Cath and Coronary Angiography;  Surgeon: Belva Crome, MD;  Location: [facility];  Service: Cardiovascular;  Laterality: N/A;  . EYE MUSCLE SURGERY x3     2 left eye, 1 right eye  . EYE SURGERY    . KNEE SURGERY, LEFT    . VIDEO BRONCHOSCOPY  05/08/2011   Procedure: VIDEO BRONCHOSCOPY;  Surgeon: Grace Isaac, MD;  Location: [facility] OR;  Service: Thoracic;  Laterality: N/A;    Home Medications    Prior to Admission medications   Medication Sig Start Date End Date Taking? Authorizing Provider  albuterol (PROVENTIL HFA;VENTOLIN HFA) 108 (90 Base) MCG/ACT inhaler Inhale 1-2 puffs into the lungs every 6 (six) hours as needed for wheezing or shortness of breath. 04/01/17  Yes Norval Gable, MD  aspirin 81 MG tablet Take 1 tablet (81 mg total) by mouth daily. 08/30/15  Yes Sherren Mocha, MD  Aspirin-Acetaminophen ([place]  PO) Take 1 packet by mouth daily as needed. backpain   Yes [provider]  cholecalciferol (VITAMIN D) 1000 units tablet Take 1,000 Units by mouth daily.   Yes [provider]  levothyroxine (SYNTHROID, LEVOTHROID) 88 MCG tablet Take 88 mcg by mouth daily before breakfast.   Yes [provider]  losartan-hydrochlorothiazide (HYZAAR) 100-12.5 MG per tablet Take 1 tablet by mouth daily.   Yes [provider]  Multiple Vitamin (MULITIVITAMIN WITH MINERALS) TABS Take 1 tablet by mouth daily.   Yes [provider]  nitroGLYCERIN (NITROSTAT) 0.4 MG SL tablet Place 1 tablet (0.4 mg total) under the tongue every 5 (five) minutes as needed for  chest pain. 03/24/12  Yes [facility], Scott T, PA-C  Omega-3 Fatty Acids (FISH OIL) 1200 MG CAPS Take 1,200 mg by mouth daily.   Yes [provider]  omeprazole (PRILOSEC) 20 MG capsule Take 1 capsule by mouth daily. 07/20/11  Yes [provider]  predniSONE (DELTASONE) 10 MG tablet Start 60 mg po day one, then 50 mg po day two, taper by 10 mg daily until complete. 06/11/17  Yes Marylene Land, NP  rOPINIRole (REQUIP) 0.25 MG tablet Take 0.25 mg by mouth at bedtime.   Yes [provider]  benzonatate (TESSALON PERLES) 100 MG capsule Take 1 capsule (100 mg total) by mouth 3 (three) times daily as needed for cough. 06/11/17   Marylene Land, NP  doxycycline (VIBRAMYCIN) 100 MG capsule Take 1 capsule (100 mg total) by mouth 2 (two) times daily. 06/11/17   Marylene Land, NP  lovastatin (MEVACOR) 20 MG tablet Take 20 mg by mouth at bedtime.    [provider]    Family History Family History  Problem Relation Age of Onset  . Heart disease Sister 107       Died  . Heart failure Father   . Heart attack Father 62       Died of MI at 70  . Emphysema Mother   . Heart disease Mother   . Allergies Brother   . Heart failure Brother   . Cancer Brother   . Allergies Sister        x2  . Esophageal cancer Cousin   . Stomach cancer Cousin   . Lymphoma Cousin     Social History Social History   Tobacco Use  . Smoking status: Former Smoker    Packs/day: 1.00    Years: 40.00    Pack years: 40.00    Types: Cigarettes    Last attempt to quit: 04/18/2011    Years since quitting: 6.2  . Smokeless tobacco: Current User    Types: Snuff  . Tobacco comment: uses pouches 3 times a day  Substance Use Topics  . Alcohol use: No  . Drug use: No     Allergies   Omnipaque [iohexol]; Moxifloxacin; Avelox [moxifloxacin hcl in nacl]; and Codeine   Review of Systems Review of Systems  Constitutional: Negative for fever.  Respiratory: Positive for cough, chest tightness  and shortness of breath.   Neurological: Positive for weakness and headaches.   Physical Exam Triage Vital Signs ED Triage Vitals  Enc Vitals Group     BP 07/05/17 1017 (!) 139/92     Pulse Rate 07/05/17 1017 (!) 30     Resp --      Temp 07/05/17 1017 98.7 F (37.1 C)     Temp src --      SpO2 07/05/17 1017 98 %  Weight 07/05/17 1018 148 lb (67.1 kg)     Height --      Head Circumference --      Peak Flow --      Pain Score 07/05/17 1018 3     Pain Loc --      Pain Edu? --      Excl. in [place]? --    Updated Vital Signs BP (!) 139/92 (BP Location: Left Arm)   Pulse (!) 30   Temp 98.7 F (37.1 C)   Wt 148 lb (67.1 kg)   SpO2 98%   BMI 24.63 kg/m   Physical Exam  Constitutional: He is oriented to person, place, and time. He appears well-developed.  Appears fatigued.  No acute distress.  HENT:  Head: Normocephalic and atraumatic.  Nose: Nose normal.  Eyes: Conjunctivae are normal. Right eye exhibits no discharge. Left eye exhibits no discharge.  Cardiovascular:  Irregular.  Likely due to ectopy.  Pulmonary/Chest: Effort normal.  Patient with wheezing anteriorly.  Decreased breath sounds posteriorly.  Neurological: He is alert and oriented to person, place, and time.  Psychiatric: He has a normal mood and affect. His behavior is normal.  Nursing note and vitals reviewed.  UC Treatments / Results  Labs (all labs ordered are listed, but only abnormal results are displayed) Labs Reviewed  CBC WITH DIFFERENTIAL/PLATELET - Abnormal; Notable for the following components:      Result Value   WBC 12.2 (*)    Neutro Abs 8.7 (*)    Monocytes Absolute 1.4 (*)    All other components within normal limits  COMPREHENSIVE METABOLIC PANEL - Abnormal; Notable for the following components:   Sodium 134 (*)    Chloride 98 (*)    Glucose, Bld 100 (*)    Calcium 8.8 (*)    All other components within normal limits  TROPONIN I    ED ECG REPORT   Date: 07/05/2017  EKG Time:  12:21 PM  Rate: 100  Rhythm: Normal sinus rhythm with frequent PAC's.  Axis: Normal.  Intervals: PAC's.  ST&T Change: No ST or T wave changes.  Narrative Interpretation: Sinus rhythm with frequent PACs.  No ST or T wave changes.  Radiology Dg Chest 2 View  Result Date: 07/05/2017 CLINICAL DATA:  67 year old male with cough, congestion and shortness of breath since January EXAM: CHEST - 2 VIEW COMPARISON:  Prior chest x-ray 02/01/2014 FINDINGS: Cardiac and mediastinal contours are within normal limits. The lungs are clear. Chained sutures in the right lung apex suggest prior wedge resection. Mild hyperinflation. No acute osseous abnormality. IMPRESSION: 1. Stable chest x-ray without evidence of acute cardiopulmonary process. 2. Surgical changes of prior right upper lobe wedge resection. 3. Hyperinflation and background bronchitic changes suggest underlying COPD. 4.  Aortic Atherosclerosis (ICD10-170.0) Electronically Signed   By: Jacqulynn Cadet M.D.   On: 07/05/2017 11:40    Procedures Procedures (including critical care time)  Medications Ordered in UC Medications  ipratropium-albuterol (DUONEB) 0.5-2.5 (3) MG/3ML nebulizer solution 3 mL (3 mLs Nebulization Given 07/05/17 1126)     Initial Impression / Assessment and Plan / UC Course  I have reviewed the triage vital signs and the nursing notes.  Pertinent labs & imaging results that were available during my care of the patient were reviewed by me and considered in my medical decision making (see chart for details).     67 year old male presents with ongoing respiratory symptoms.  Patient likely has a viral illness.  His laboratory studies  revealed a mildly elevated white count.  Troponin negative. Metabolic panel unremarkable. EKG nonischemic.  Patient with uncontrolled COPD.  I discussed treatment with steroids and antibiotics and patient and I elected to not treat given recent treatment and lack of improvement.  We are both concerned  about side effects.  Patient is going to continue conservative care at home and pulmonology on Monday.  Final Clinical Impressions(s) / UC Diagnoses   Final diagnoses:  Viral illness    ED Discharge Orders    None     Controlled Substance Prescriptions [place] Controlled Substance Registry consulted? Not Applicable   Coral Spikes, DO 07/05/17 1224

## 2017-07-05 NOTE — Discharge Instructions (Signed)
Everything checks out okay.  Rest.  Call pulmonology on Monday.  Take care  Dr. Lacinda Axon

## 2017-07-05 NOTE — ED Triage Notes (Signed)
Per patient cough / sinus drainage/ congestion / sinus headache. Per patient his third time visiting and not feeling any better.

## 2017-07-08 DIAGNOSIS — H25813 Combined forms of age-related cataract, bilateral: Secondary | ICD-10-CM | POA: Diagnosis not present

## 2017-07-09 DIAGNOSIS — F411 Generalized anxiety disorder: Secondary | ICD-10-CM | POA: Diagnosis not present

## 2017-07-14 DIAGNOSIS — F411 Generalized anxiety disorder: Secondary | ICD-10-CM | POA: Diagnosis not present

## 2017-07-24 DIAGNOSIS — F411 Generalized anxiety disorder: Secondary | ICD-10-CM | POA: Diagnosis not present

## 2017-07-29 DIAGNOSIS — E039 Hypothyroidism, unspecified: Secondary | ICD-10-CM | POA: Diagnosis not present

## 2017-07-29 DIAGNOSIS — I10 Essential (primary) hypertension: Secondary | ICD-10-CM | POA: Diagnosis not present

## 2017-07-29 DIAGNOSIS — R05 Cough: Secondary | ICD-10-CM | POA: Diagnosis not present

## 2017-07-29 DIAGNOSIS — Z79899 Other long term (current) drug therapy: Secondary | ICD-10-CM | POA: Diagnosis not present

## 2017-07-29 DIAGNOSIS — E785 Hyperlipidemia, unspecified: Secondary | ICD-10-CM | POA: Diagnosis not present

## 2017-08-04 DIAGNOSIS — F411 Generalized anxiety disorder: Secondary | ICD-10-CM | POA: Diagnosis not present

## 2017-08-07 HISTORY — PX: COLONOSCOPY: SHX174

## 2017-08-11 DIAGNOSIS — F411 Generalized anxiety disorder: Secondary | ICD-10-CM | POA: Diagnosis not present

## 2017-08-18 DIAGNOSIS — F411 Generalized anxiety disorder: Secondary | ICD-10-CM | POA: Diagnosis not present

## 2017-08-26 DIAGNOSIS — H1089 Other conjunctivitis: Secondary | ICD-10-CM | POA: Diagnosis not present

## 2017-08-26 DIAGNOSIS — I10 Essential (primary) hypertension: Secondary | ICD-10-CM | POA: Diagnosis not present

## 2017-08-26 DIAGNOSIS — H00022 Hordeolum internum right lower eyelid: Secondary | ICD-10-CM | POA: Diagnosis not present

## 2017-09-09 DIAGNOSIS — F411 Generalized anxiety disorder: Secondary | ICD-10-CM | POA: Diagnosis not present

## 2017-09-23 DIAGNOSIS — F411 Generalized anxiety disorder: Secondary | ICD-10-CM | POA: Diagnosis not present

## 2017-09-29 DIAGNOSIS — F411 Generalized anxiety disorder: Secondary | ICD-10-CM | POA: Diagnosis not present

## 2017-09-29 DIAGNOSIS — H0011 Chalazion right upper eyelid: Secondary | ICD-10-CM | POA: Diagnosis not present

## 2017-11-03 DIAGNOSIS — J301 Allergic rhinitis due to pollen: Secondary | ICD-10-CM | POA: Diagnosis not present

## 2017-11-03 DIAGNOSIS — J432 Centrilobular emphysema: Secondary | ICD-10-CM | POA: Diagnosis not present

## 2017-11-03 DIAGNOSIS — R0609 Other forms of dyspnea: Secondary | ICD-10-CM | POA: Diagnosis not present

## 2017-11-03 DIAGNOSIS — Z87891 Personal history of nicotine dependence: Secondary | ICD-10-CM | POA: Diagnosis not present

## 2017-11-03 DIAGNOSIS — J449 Chronic obstructive pulmonary disease, unspecified: Secondary | ICD-10-CM | POA: Diagnosis not present

## 2017-12-02 ENCOUNTER — Encounter: Payer: Self-pay | Admitting: Gastroenterology

## 2017-12-23 ENCOUNTER — Ambulatory Visit: Payer: Medicare Other | Admitting: Gastroenterology

## 2017-12-25 DIAGNOSIS — F411 Generalized anxiety disorder: Secondary | ICD-10-CM | POA: Diagnosis not present

## 2018-01-07 ENCOUNTER — Ambulatory Visit: Payer: Medicare Other | Admitting: Gastroenterology

## 2018-01-07 DIAGNOSIS — J432 Centrilobular emphysema: Secondary | ICD-10-CM | POA: Diagnosis not present

## 2018-01-07 DIAGNOSIS — R0609 Other forms of dyspnea: Secondary | ICD-10-CM | POA: Diagnosis not present

## 2018-02-06 ENCOUNTER — Encounter: Payer: Self-pay | Admitting: Gastroenterology

## 2018-02-06 ENCOUNTER — Ambulatory Visit (INDEPENDENT_AMBULATORY_CARE_PROVIDER_SITE_OTHER): Payer: Medicare Other | Admitting: Gastroenterology

## 2018-02-06 VITALS — BP 122/68 | HR 74 | Ht 65.0 in | Wt 147.1 lb

## 2018-02-06 DIAGNOSIS — K625 Hemorrhage of anus and rectum: Secondary | ICD-10-CM | POA: Diagnosis not present

## 2018-02-06 DIAGNOSIS — R197 Diarrhea, unspecified: Secondary | ICD-10-CM

## 2018-02-06 MED ORDER — PEG-KCL-NACL-NASULF-NA ASC-C 140 G PO SOLR: 140.0000 g | ORAL | 0 refills | Status: AC

## 2018-02-06 MED ORDER — DIPHENOXYLATE-ATROPINE 2.5-0.025 MG PO TABS: 1.0000 | ORAL_TABLET | Freq: Three times a day (TID) | ORAL | 0 refills | Status: AC | PRN

## 2018-02-06 NOTE — Progress Notes (Signed)
Chief Complaint: Diarrhea  Referring Provider:  Ronita Hipps, MD      ASSESSMENT AND PLAN;   #1. Diarrhea. D/d includes irritable bowel syndrome with predominant diarrhea, infectious causes, diarrhea due to medications, microscopic colitis, IBD, malabsorption, celiac disease and hypothyroidism. #2. Rectal bleeing (rare)  Plan - Stool studies for GI Pathogen (includes C. Diff), WBCs, culture, O&P. - Stop fish oil x 2 weeks to determine if the diarrhea gets better. - Continue Mg supplements for now.  He does understand that it can increase diarrhea. - Lomotil 1-3/day as needed. - Proceed with colonoscopy if the stool studies are negative.  I have discussed the risks and benefits.  The risks including risk of perforation requiring laparotomy, bleeding after polypectomy requiring blood transfusions and risks of anesthesia/sedation were discussed.  Rare risks of missing colorectal neoplasms were also discussed.  Alternatives were given.  Patient is fully aware and agrees to proceed. All the questions were answered. Colonoscopy will be scheduled in upcoming days.  Patient is to report immediately if there is any significant weight loss or excessive bleeding until then. Consent forms were given for review. - If still with problems, add calcium supplements and give him a trial of cholestyramine. - Follow-up if still with problems.   HPI:    Caleb [name] is a 67 y.o. male  Diarrhea since April  Mostly in AM 5-6/day, mushy stool Has been off and on antibiotics - for pulmonary infections Denies having any abdominal pain.  No nocturnal symptoms.  Cheese makes it better.  Otherwise, other foods would exacerbate diarrhea. Any significant abdominal pain. Wants to get colonoscopy performed Had rare rectal bleeding Been taking aspirin once a day. Had low [place] Stopped omeprazole Now taking magnesium 400/day -has reduced to once a day.  Previously was taking it twice a day.  Cramps are  better.  The diarrhea precedes magnesium. Any significant upper GI symptoms including nausea, vomiting, heartburn, regurgitation, odynophagia or dysphagia. Has lost 5 or 6 pounds over the last 1 year.  View of previous records Colonoscopy 07/2010 ([facility])-X millimeter colonic polyp status post polypectomy, mild pancolonic diverticulosis Past Medical History:  Diagnosis Date  . Allergic rhinitis   . COPD (chronic obstructive pulmonary disease) ([place])    emyphesema  also  . Coronary artery disease    a. sm blockages on 2007 cath;  b. ETT-MV 1/13:  no ischemia with an EF of 58%;  Lex MV 1/14:  EF 59%, no ischemia  . Emphysema   . GERD (gastroesophageal reflux disease)   . Headache(784.0)   . HOH (hard of hearing)    BOTH.....Marland KitchenLEFT IS BETTER THEN RIGHT  . Hx of echocardiogram    a. echo 12/13:  EF 50-55%, Gr 1 diast dysfn, mild AI  . Hyperlipidemia   . Hypertension   . Recurrent upper respiratory infection (URI)    NOV  & DECEMBER 2012  . Shortness of breath    DOE    Past Surgical History:  Procedure Laterality Date  . BACK SURGERY     LOWER WITH FUSION  . CARDIAC CATHETERIZATION     2ND CATH IN 2007 @  [facility]  . CARDIAC CATHETERIZATION N/A 02/14/2016   Procedure: Left Heart Cath and Coronary Angiography;  Surgeon: Belva Crome, MD;  Location: [facility];  Service: Cardiovascular;  Laterality: N/A;  . COLONOSCOPY  2012  . EYE MUSCLE SURGERY x3     2 left eye, 1 right eye  .  EYE SURGERY    . KNEE SURGERY, LEFT     x2  . LUNG REMOVAL, PARTIAL  2013   right upper lobe   . VIDEO BRONCHOSCOPY  05/08/2011   Procedure: VIDEO BRONCHOSCOPY;  Surgeon: Grace Isaac, MD;  Location: [facility] OR;  Service: Thoracic;  Laterality: N/A;    Family History  Problem Relation Age of Onset  . Heart disease Sister 68       Died  . Heart failure Father   . Heart attack Father 22       Died of MI at 2  . Emphysema Mother   . Heart disease Mother   . Allergies Brother   . Heart  failure Brother   . Cancer Brother   . Allergies Sister        x2  . Esophageal cancer Cousin   . Stomach cancer Cousin   . Lymphoma Cousin   . Colon cancer Neg Hx     Social History   Tobacco Use  . Smoking status: Former Smoker    Packs/day: 1.00    Years: 40.00    Pack years: 40.00    Types: Cigarettes    Last attempt to quit: 04/18/2011    Years since quitting: 6.8  . Smokeless tobacco: Current User    Types: Snuff  . Tobacco comment: uses pouches 3 times a day  Substance Use Topics  . Alcohol use: Yes    Comment: ocassionally  . Drug use: No    Current Outpatient Medications  Medication Sig Dispense Refill  . albuterol (PROVENTIL HFA;VENTOLIN HFA) 108 (90 Base) MCG/ACT inhaler Inhale 1-2 puffs into the lungs every 6 (six) hours as needed for wheezing or shortness of breath. 1 Inhaler 0  . aspirin 81 MG tablet Take 1 tablet (81 mg total) by mouth daily. 30 tablet   . Aspirin-Acetaminophen ([place] BODY PAIN PO) Take 1 packet by mouth daily as needed. backpain    . cholecalciferol (VITAMIN D) 1000 units tablet Take 1,000 Units by mouth daily.    [name] doxycycline (VIBRAMYCIN) 100 MG capsule Take 1 capsule (100 mg total) by mouth 2 (two) times daily. 20 capsule 0  . ipratropium-albuterol (DUONEB) 0.5-2.5 (3) MG/3ML SOLN Inhale into the lungs QID.    [name] levothyroxine (SYNTHROID, LEVOTHROID) 88 MCG tablet Take 88 mcg by mouth daily before breakfast.    . losartan-hydrochlorothiazide (HYZAAR) 100-12.5 MG per tablet Take 1 tablet by mouth daily.    . nitroGLYCERIN (NITROSTAT) 0.4 MG SL tablet Place 1 tablet (0.4 mg total) under the tongue every 5 (five) minutes as needed for chest pain. 25 tablet 3  . Omega-3 Fatty Acids (FISH OIL) 1200 MG CAPS Take 1,200 mg by mouth daily.     No current facility-administered medications for this visit.     Allergies  Allergen Reactions  . Omnipaque [Iohexol] Hives and Other (See Comments)    Hives,itching, 13 hr prep  . Moxifloxacin Itching    . Avelox [Moxifloxacin Hcl In Nacl] Itching and Rash  . Codeine Rash    Review of Systems:  Constitutional: Denies fever, chills, diaphoresis, appetite change and fatigue.  HEENT: Denies photophobia, eye pain, redness, hearing loss, ear pain, congestion, sore throat, rhinorrhea, sneezing, mouth sores, neck pain, neck stiffness and tinnitus.   Respiratory: Denies SOB, DOE, cough, chest tightness,  and wheezing.   Cardiovascular: Denies chest pain, palpitations and leg swelling.  Genitourinary: Denies dysuria, urgency, frequency, hematuria, flank pain and difficulty urinating.  Musculoskeletal: Denies myalgias,  back pain, joint swelling, arthralgias and gait problem.  Skin: No rash.  Neurological: Denies dizziness, seizures, syncope, weakness, light-headedness, numbness and headaches.  Hematological: Denies adenopathy. Easy bruising, personal or family bleeding history  Psychiatric/Behavioral: No anxiety or depression     Physical Exam:    BP 122/68   Pulse 74   Ht 5\' 5"  (1.651 m)   Wt 147 lb 2 oz (66.7 kg)   BMI 24.48 kg/m  Filed Weights   02/06/18 1132  Weight: 147 lb 2 oz (66.7 kg)   Constitutional:  Well-developed, in no acute distress. Psychiatric: Normal mood and affect. Behavior is normal. HEENT: Pupils normal.  Conjunctivae are normal. No scleral icterus. Neck supple.  Cardiovascular: Normal rate, regular rhythm. No edema Pulmonary/chest: Effort normal and breath sounds-bilateral decreased. No wheezing, rales or rhonchi. Abdominal: Soft, nondistended. Nontender. Bowel sounds active throughout. There are no masses palpable. No hepatomegaly. Rectal:  defered Neurological: Alert and oriented to person place and time. Skin: Skin is warm and dry. No rashes noted.  Data Reviewed: I have personally reviewed following labs and imaging studies  CBC: CBC Latest Ref Rng & Units 07/05/2017 02/13/2016 03/04/2012  WBC 3.8 - 10.6 K/uL 12.2(H) 6.9 10.5  Hemoglobin 13.0 - 18.0 g/dL  14.3 14.1 14.0  Hematocrit 40.0 - 52.0 % 41.4 41.3 42.0  Platelets 150 - 440 K/uL 301 300 341.0    CMP: CMP Latest Ref Rng & Units 07/05/2017 02/13/2016 03/04/2012  Glucose 65 - 99 mg/dL 100(H) 96 95  BUN 6 - 20 mg/dL 12 14 15   Creatinine 0.61 - 1.24 mg/dL 1.08 1.09 1.1  Sodium 135 - 145 mmol/L 134(L) 139 134(L)  Potassium 3.5 - 5.1 mmol/L 3.9 3.9 3.9  Chloride 101 - 111 mmol/L 98(L) 101 96  CO2 22 - 32 mmol/L 28 28 27   Calcium 8.9 - 10.3 mg/dL 8.8(L) 9.4 9.1  Total Protein 6.5 - 8.1 g/dL 7.2 - 7.9  Total Bilirubin 0.3 - 1.2 mg/dL 0.5 - 0.7  Alkaline Phos 38 - 126 U/L 65 - 64  AST 15 - 41 U/L 24 - 27  ALT 17 - 63 U/L 18 - 26     Carmell Austria, MD 02/06/2018, 11:54 AM  Cc: Ronita Hipps, MD

## 2018-02-06 NOTE — Patient Instructions (Addendum)
If you are age 65 or older, your body mass index should be between 23-30. Your Body mass index is 24.48 kg/m. If this is out of the aforementioned range listed, please consider follow up with your Primary Care Provider.  If you are age 47 or younger, your body mass index should be between 19-25. Your Body mass index is 24.48 kg/m. If this is out of the aformentioned range listed, please consider follow up with your Primary Care Provider.   You have been scheduled for a colonoscopy. Please follow written instructions given to you at your visit today.  Please pick up your prep supplies at the pharmacy within the next 1-3 days. If you use inhalers (even only as needed), please bring them with you on the day of your procedure. Your physician has requested that you go to www.startemmi.com and enter the access code given to you at your visit today. This web site gives a general overview about your procedure. However, you should still follow specific instructions given to you by our office regarding your preparation for the procedure.    Please take your stool specimen with your lab order to [facility]  Stop Fish Oil x 2 weeks.   Thank you,  Dr. Jackquline Denmark

## 2018-02-17 DIAGNOSIS — K529 Noninfective gastroenteritis and colitis, unspecified: Secondary | ICD-10-CM | POA: Diagnosis not present

## 2018-03-02 ENCOUNTER — Telehealth: Payer: Self-pay | Admitting: Gastroenterology

## 2018-03-02 DIAGNOSIS — R197 Diarrhea, unspecified: Secondary | ICD-10-CM

## 2018-03-03 ENCOUNTER — Encounter: Payer: Self-pay | Admitting: Gastroenterology

## 2018-03-03 NOTE — Telephone Encounter (Signed)
Pt said he is returning your call [phone]

## 2018-03-03 NOTE — Telephone Encounter (Signed)
Left message for patient to call back  

## 2018-03-04 NOTE — Telephone Encounter (Signed)
Pt is returning your call

## 2018-03-04 NOTE — Telephone Encounter (Signed)
Called and spoke with patient-patient reports no decrease in symptoms even with the Lomotil Rx (which was only 10 days); patient reports he "got some Imodium day before yesterday and I am taking it like the box says but it is not helping much at all"; patient has not had a decrease or relief in symptoms; Dr. [name], please advise on next step;  Patient reports he completed the stool samples at [facility] the same day he was seen by Dr. [name];    Larena Glassman, can you please try to obtain these lab results for Dr. [name] to review;  Thank you

## 2018-03-05 MED ORDER — CHOLESTYRAMINE 4 G PO PACK: 4.0000 g | PACK | Freq: Two times a day (BID) | ORAL | 2 refills | Status: AC

## 2018-03-05 NOTE — Telephone Encounter (Signed)
I have sent a release to [facility] for these records.

## 2018-03-05 NOTE — Telephone Encounter (Signed)
Lets start cholestyramine powder 4 g p.o. twice daily, 2 hours before or after rest of the medications  60, 2 refills Is a scheduled for colon?

## 2018-03-05 NOTE — Telephone Encounter (Signed)
I have received these from [facility]. I have notified patient that I will give them to Dr. [name] to be reviewed.

## 2018-03-05 NOTE — Telephone Encounter (Signed)
Caleb [name], please notify the patient when you have received the records from [name] is insistent on making sure we receive these records for Dr. [name] to review with him;  Called and spoke with patient-patient informed of MD recommendations and is agreeable with the plan of care; Rx submitted to pharmacy of choice after being verified by patient; patient verbalized understanding of information/instructions; patient advised to call back if questions/concerns arise;

## 2018-03-12 DIAGNOSIS — F411 Generalized anxiety disorder: Secondary | ICD-10-CM | POA: Diagnosis not present

## 2018-03-17 ENCOUNTER — Encounter: Payer: Medicare Other | Admitting: Gastroenterology

## 2018-05-02 DIAGNOSIS — K219 Gastro-esophageal reflux disease without esophagitis: Secondary | ICD-10-CM | POA: Diagnosis not present

## 2018-05-02 DIAGNOSIS — M4316 Spondylolisthesis, lumbar region: Secondary | ICD-10-CM | POA: Diagnosis not present

## 2018-05-02 DIAGNOSIS — M47814 Spondylosis without myelopathy or radiculopathy, thoracic region: Secondary | ICD-10-CM | POA: Diagnosis not present

## 2018-05-02 DIAGNOSIS — J439 Emphysema, unspecified: Secondary | ICD-10-CM | POA: Diagnosis not present

## 2018-05-02 DIAGNOSIS — J449 Chronic obstructive pulmonary disease, unspecified: Secondary | ICD-10-CM | POA: Diagnosis not present

## 2018-05-02 DIAGNOSIS — R937 Abnormal findings on diagnostic imaging of other parts of musculoskeletal system: Secondary | ICD-10-CM | POA: Diagnosis not present

## 2018-05-02 DIAGNOSIS — M5442 Lumbago with sciatica, left side: Secondary | ICD-10-CM | POA: Diagnosis not present

## 2018-05-02 DIAGNOSIS — R531 Weakness: Secondary | ICD-10-CM | POA: Diagnosis not present

## 2018-05-02 DIAGNOSIS — R0602 Shortness of breath: Secondary | ICD-10-CM | POA: Diagnosis not present

## 2018-05-02 DIAGNOSIS — I700 Atherosclerosis of aorta: Secondary | ICD-10-CM | POA: Diagnosis not present

## 2018-05-02 DIAGNOSIS — R1032 Left lower quadrant pain: Secondary | ICD-10-CM | POA: Diagnosis not present

## 2018-05-02 DIAGNOSIS — E039 Hypothyroidism, unspecified: Secondary | ICD-10-CM | POA: Diagnosis not present

## 2018-05-02 DIAGNOSIS — E871 Hypo-osmolality and hyponatremia: Secondary | ICD-10-CM | POA: Diagnosis not present

## 2018-05-02 DIAGNOSIS — I10 Essential (primary) hypertension: Secondary | ICD-10-CM | POA: Diagnosis not present

## 2018-05-02 DIAGNOSIS — R109 Unspecified abdominal pain: Secondary | ICD-10-CM | POA: Diagnosis not present

## 2018-05-02 DIAGNOSIS — M5116 Intervertebral disc disorders with radiculopathy, lumbar region: Secondary | ICD-10-CM | POA: Diagnosis not present

## 2018-05-02 DIAGNOSIS — M545 Low back pain: Secondary | ICD-10-CM | POA: Diagnosis not present

## 2018-05-02 DIAGNOSIS — Z8619 Personal history of other infectious and parasitic diseases: Secondary | ICD-10-CM | POA: Diagnosis not present

## 2018-05-03 DIAGNOSIS — J439 Emphysema, unspecified: Secondary | ICD-10-CM | POA: Diagnosis present

## 2018-05-03 DIAGNOSIS — M545 Low back pain: Secondary | ICD-10-CM | POA: Diagnosis not present

## 2018-05-03 DIAGNOSIS — M47816 Spondylosis without myelopathy or radiculopathy, lumbar region: Secondary | ICD-10-CM | POA: Diagnosis not present

## 2018-05-03 DIAGNOSIS — K219 Gastro-esophageal reflux disease without esophagitis: Secondary | ICD-10-CM | POA: Diagnosis present

## 2018-05-03 DIAGNOSIS — M2578 Osteophyte, vertebrae: Secondary | ICD-10-CM | POA: Diagnosis not present

## 2018-05-03 DIAGNOSIS — I2510 Atherosclerotic heart disease of native coronary artery without angina pectoris: Secondary | ICD-10-CM | POA: Diagnosis present

## 2018-05-03 DIAGNOSIS — I700 Atherosclerosis of aorta: Secondary | ICD-10-CM | POA: Diagnosis not present

## 2018-05-03 DIAGNOSIS — Z902 Acquired absence of lung [part of]: Secondary | ICD-10-CM | POA: Diagnosis not present

## 2018-05-03 DIAGNOSIS — E871 Hypo-osmolality and hyponatremia: Secondary | ICD-10-CM | POA: Diagnosis present

## 2018-05-03 DIAGNOSIS — E039 Hypothyroidism, unspecified: Secondary | ICD-10-CM | POA: Diagnosis present

## 2018-05-03 DIAGNOSIS — M5116 Intervertebral disc disorders with radiculopathy, lumbar region: Secondary | ICD-10-CM | POA: Diagnosis present

## 2018-05-03 DIAGNOSIS — M48061 Spinal stenosis, lumbar region without neurogenic claudication: Secondary | ICD-10-CM | POA: Diagnosis not present

## 2018-05-03 DIAGNOSIS — M79605 Pain in left leg: Secondary | ICD-10-CM | POA: Diagnosis not present

## 2018-05-03 DIAGNOSIS — M5442 Lumbago with sciatica, left side: Secondary | ICD-10-CM | POA: Diagnosis not present

## 2018-05-03 DIAGNOSIS — I712 Thoracic aortic aneurysm, without rupture: Secondary | ICD-10-CM | POA: Diagnosis present

## 2018-05-03 DIAGNOSIS — M5416 Radiculopathy, lumbar region: Secondary | ICD-10-CM | POA: Diagnosis not present

## 2018-05-03 DIAGNOSIS — M549 Dorsalgia, unspecified: Secondary | ICD-10-CM | POA: Diagnosis not present

## 2018-05-03 DIAGNOSIS — Z87891 Personal history of nicotine dependence: Secondary | ICD-10-CM | POA: Diagnosis not present

## 2018-05-03 DIAGNOSIS — I10 Essential (primary) hypertension: Secondary | ICD-10-CM | POA: Diagnosis present

## 2018-05-03 DIAGNOSIS — Z4789 Encounter for other orthopedic aftercare: Secondary | ICD-10-CM | POA: Diagnosis not present

## 2018-05-03 DIAGNOSIS — Z7982 Long term (current) use of aspirin: Secondary | ICD-10-CM | POA: Diagnosis not present

## 2018-05-03 DIAGNOSIS — E785 Hyperlipidemia, unspecified: Secondary | ICD-10-CM | POA: Diagnosis present

## 2018-05-03 DIAGNOSIS — M4316 Spondylolisthesis, lumbar region: Secondary | ICD-10-CM | POA: Diagnosis not present

## 2018-05-06 DIAGNOSIS — M1990 Unspecified osteoarthritis, unspecified site: Secondary | ICD-10-CM | POA: Diagnosis not present

## 2018-05-06 DIAGNOSIS — M4316 Spondylolisthesis, lumbar region: Secondary | ICD-10-CM | POA: Diagnosis not present

## 2018-05-06 DIAGNOSIS — M545 Low back pain: Secondary | ICD-10-CM | POA: Diagnosis not present

## 2018-05-06 DIAGNOSIS — Z981 Arthrodesis status: Secondary | ICD-10-CM | POA: Diagnosis not present

## 2018-05-06 DIAGNOSIS — E785 Hyperlipidemia, unspecified: Secondary | ICD-10-CM | POA: Diagnosis not present

## 2018-05-06 DIAGNOSIS — F17220 Nicotine dependence, chewing tobacco, uncomplicated: Secondary | ICD-10-CM | POA: Diagnosis not present

## 2018-05-06 DIAGNOSIS — J449 Chronic obstructive pulmonary disease, unspecified: Secondary | ICD-10-CM | POA: Diagnosis not present

## 2018-05-06 DIAGNOSIS — I2510 Atherosclerotic heart disease of native coronary artery without angina pectoris: Secondary | ICD-10-CM | POA: Diagnosis not present

## 2018-05-06 DIAGNOSIS — K219 Gastro-esophageal reflux disease without esophagitis: Secondary | ICD-10-CM | POA: Diagnosis not present

## 2018-05-06 DIAGNOSIS — E039 Hypothyroidism, unspecified: Secondary | ICD-10-CM | POA: Diagnosis not present

## 2018-05-06 DIAGNOSIS — M5116 Intervertebral disc disorders with radiculopathy, lumbar region: Secondary | ICD-10-CM | POA: Diagnosis not present

## 2018-05-06 DIAGNOSIS — M79605 Pain in left leg: Secondary | ICD-10-CM | POA: Diagnosis not present

## 2018-05-06 DIAGNOSIS — Z8619 Personal history of other infectious and parasitic diseases: Secondary | ICD-10-CM | POA: Diagnosis not present

## 2018-05-06 DIAGNOSIS — I10 Essential (primary) hypertension: Secondary | ICD-10-CM | POA: Diagnosis not present

## 2018-05-07 DIAGNOSIS — R58 Hemorrhage, not elsewhere classified: Secondary | ICD-10-CM | POA: Diagnosis not present

## 2018-05-07 DIAGNOSIS — E871 Hypo-osmolality and hyponatremia: Secondary | ICD-10-CM | POA: Diagnosis not present

## 2018-05-08 DIAGNOSIS — M79605 Pain in left leg: Secondary | ICD-10-CM | POA: Diagnosis not present

## 2018-05-08 DIAGNOSIS — J449 Chronic obstructive pulmonary disease, unspecified: Secondary | ICD-10-CM | POA: Diagnosis not present

## 2018-05-08 DIAGNOSIS — M5116 Intervertebral disc disorders with radiculopathy, lumbar region: Secondary | ICD-10-CM | POA: Diagnosis not present

## 2018-05-08 DIAGNOSIS — M545 Low back pain: Secondary | ICD-10-CM | POA: Diagnosis not present

## 2018-05-08 DIAGNOSIS — I10 Essential (primary) hypertension: Secondary | ICD-10-CM | POA: Diagnosis not present

## 2018-05-08 DIAGNOSIS — M4316 Spondylolisthesis, lumbar region: Secondary | ICD-10-CM | POA: Diagnosis not present

## 2018-05-11 DIAGNOSIS — J449 Chronic obstructive pulmonary disease, unspecified: Secondary | ICD-10-CM | POA: Diagnosis not present

## 2018-05-11 DIAGNOSIS — M545 Low back pain: Secondary | ICD-10-CM | POA: Diagnosis not present

## 2018-05-11 DIAGNOSIS — M4316 Spondylolisthesis, lumbar region: Secondary | ICD-10-CM | POA: Diagnosis not present

## 2018-05-11 DIAGNOSIS — M79605 Pain in left leg: Secondary | ICD-10-CM | POA: Diagnosis not present

## 2018-05-11 DIAGNOSIS — I10 Essential (primary) hypertension: Secondary | ICD-10-CM | POA: Diagnosis not present

## 2018-05-11 DIAGNOSIS — M5116 Intervertebral disc disorders with radiculopathy, lumbar region: Secondary | ICD-10-CM | POA: Diagnosis not present

## 2018-05-13 DIAGNOSIS — M79605 Pain in left leg: Secondary | ICD-10-CM | POA: Diagnosis not present

## 2018-05-13 DIAGNOSIS — M545 Low back pain: Secondary | ICD-10-CM | POA: Diagnosis not present

## 2018-05-13 DIAGNOSIS — M5116 Intervertebral disc disorders with radiculopathy, lumbar region: Secondary | ICD-10-CM | POA: Diagnosis not present

## 2018-05-13 DIAGNOSIS — I10 Essential (primary) hypertension: Secondary | ICD-10-CM | POA: Diagnosis not present

## 2018-05-13 DIAGNOSIS — M4316 Spondylolisthesis, lumbar region: Secondary | ICD-10-CM | POA: Diagnosis not present

## 2018-05-13 DIAGNOSIS — F411 Generalized anxiety disorder: Secondary | ICD-10-CM | POA: Diagnosis not present

## 2018-05-13 DIAGNOSIS — J449 Chronic obstructive pulmonary disease, unspecified: Secondary | ICD-10-CM | POA: Diagnosis not present

## 2018-05-14 DIAGNOSIS — M4316 Spondylolisthesis, lumbar region: Secondary | ICD-10-CM | POA: Diagnosis not present

## 2018-05-14 DIAGNOSIS — M5116 Intervertebral disc disorders with radiculopathy, lumbar region: Secondary | ICD-10-CM | POA: Diagnosis not present

## 2018-05-14 DIAGNOSIS — I10 Essential (primary) hypertension: Secondary | ICD-10-CM | POA: Diagnosis not present

## 2018-05-14 DIAGNOSIS — M545 Low back pain: Secondary | ICD-10-CM | POA: Diagnosis not present

## 2018-05-14 DIAGNOSIS — J449 Chronic obstructive pulmonary disease, unspecified: Secondary | ICD-10-CM | POA: Diagnosis not present

## 2018-05-14 DIAGNOSIS — M79605 Pain in left leg: Secondary | ICD-10-CM | POA: Diagnosis not present

## 2018-05-19 DIAGNOSIS — J449 Chronic obstructive pulmonary disease, unspecified: Secondary | ICD-10-CM | POA: Diagnosis not present

## 2018-05-19 DIAGNOSIS — M79605 Pain in left leg: Secondary | ICD-10-CM | POA: Diagnosis not present

## 2018-05-19 DIAGNOSIS — I10 Essential (primary) hypertension: Secondary | ICD-10-CM | POA: Diagnosis not present

## 2018-05-19 DIAGNOSIS — M5116 Intervertebral disc disorders with radiculopathy, lumbar region: Secondary | ICD-10-CM | POA: Diagnosis not present

## 2018-05-19 DIAGNOSIS — M545 Low back pain: Secondary | ICD-10-CM | POA: Diagnosis not present

## 2018-05-19 DIAGNOSIS — M4316 Spondylolisthesis, lumbar region: Secondary | ICD-10-CM | POA: Diagnosis not present

## 2018-05-20 DIAGNOSIS — J449 Chronic obstructive pulmonary disease, unspecified: Secondary | ICD-10-CM | POA: Diagnosis not present

## 2018-05-20 DIAGNOSIS — M79605 Pain in left leg: Secondary | ICD-10-CM | POA: Diagnosis not present

## 2018-05-20 DIAGNOSIS — I10 Essential (primary) hypertension: Secondary | ICD-10-CM | POA: Diagnosis not present

## 2018-05-20 DIAGNOSIS — M5116 Intervertebral disc disorders with radiculopathy, lumbar region: Secondary | ICD-10-CM | POA: Diagnosis not present

## 2018-05-20 DIAGNOSIS — M4316 Spondylolisthesis, lumbar region: Secondary | ICD-10-CM | POA: Diagnosis not present

## 2018-05-20 DIAGNOSIS — M545 Low back pain: Secondary | ICD-10-CM | POA: Diagnosis not present

## 2018-05-26 DIAGNOSIS — I10 Essential (primary) hypertension: Secondary | ICD-10-CM | POA: Diagnosis not present

## 2018-05-26 DIAGNOSIS — J449 Chronic obstructive pulmonary disease, unspecified: Secondary | ICD-10-CM | POA: Diagnosis not present

## 2018-05-26 DIAGNOSIS — M4316 Spondylolisthesis, lumbar region: Secondary | ICD-10-CM | POA: Diagnosis not present

## 2018-05-26 DIAGNOSIS — M545 Low back pain: Secondary | ICD-10-CM | POA: Diagnosis not present

## 2018-05-26 DIAGNOSIS — M79605 Pain in left leg: Secondary | ICD-10-CM | POA: Diagnosis not present

## 2018-05-26 DIAGNOSIS — M5116 Intervertebral disc disorders with radiculopathy, lumbar region: Secondary | ICD-10-CM | POA: Diagnosis not present

## 2018-05-29 DIAGNOSIS — J449 Chronic obstructive pulmonary disease, unspecified: Secondary | ICD-10-CM | POA: Diagnosis not present

## 2018-05-29 DIAGNOSIS — M4316 Spondylolisthesis, lumbar region: Secondary | ICD-10-CM | POA: Diagnosis not present

## 2018-05-29 DIAGNOSIS — M5116 Intervertebral disc disorders with radiculopathy, lumbar region: Secondary | ICD-10-CM | POA: Diagnosis not present

## 2018-05-29 DIAGNOSIS — M545 Low back pain: Secondary | ICD-10-CM | POA: Diagnosis not present

## 2018-05-29 DIAGNOSIS — M79605 Pain in left leg: Secondary | ICD-10-CM | POA: Diagnosis not present

## 2018-05-29 DIAGNOSIS — I10 Essential (primary) hypertension: Secondary | ICD-10-CM | POA: Diagnosis not present
